# Patient Record
Sex: Female | Born: 1965 | Race: White | Hispanic: No | Marital: Married | Smoking: Never smoker
Health system: Southern US, Community
[De-identification: ages and names within clinical notes are randomized; demographics above are authoritative.]

## PROBLEM LIST (undated history)

## (undated) DIAGNOSIS — G43909 Migraine, unspecified, not intractable, without status migrainosus: Secondary | ICD-10-CM

## (undated) DIAGNOSIS — F419 Anxiety disorder, unspecified: Secondary | ICD-10-CM

## (undated) DIAGNOSIS — F4323 Adjustment disorder with mixed anxiety and depressed mood: Secondary | ICD-10-CM

## (undated) DIAGNOSIS — F329 Major depressive disorder, single episode, unspecified: Secondary | ICD-10-CM

## (undated) DIAGNOSIS — N926 Irregular menstruation, unspecified: Secondary | ICD-10-CM

## (undated) DIAGNOSIS — T7840XA Allergy, unspecified, initial encounter: Secondary | ICD-10-CM

## (undated) DIAGNOSIS — N941 Unspecified dyspareunia: Secondary | ICD-10-CM

## (undated) DIAGNOSIS — N952 Postmenopausal atrophic vaginitis: Secondary | ICD-10-CM

## (undated) DIAGNOSIS — R922 Inconclusive mammogram: Secondary | ICD-10-CM

## (undated) DIAGNOSIS — G43109 Migraine with aura, not intractable, without status migrainosus: Secondary | ICD-10-CM

## (undated) DIAGNOSIS — R519 Headache, unspecified: Secondary | ICD-10-CM

## (undated) DIAGNOSIS — F32A Depression, unspecified: Secondary | ICD-10-CM

## (undated) DIAGNOSIS — R51 Headache: Secondary | ICD-10-CM

## (undated) DIAGNOSIS — R030 Elevated blood-pressure reading, without diagnosis of hypertension: Secondary | ICD-10-CM

## (undated) DIAGNOSIS — Q782 Osteopetrosis: Secondary | ICD-10-CM

## (undated) DIAGNOSIS — J302 Other seasonal allergic rhinitis: Secondary | ICD-10-CM

## (undated) HISTORY — DX: Osteopetrosis: Q78.2

## (undated) HISTORY — DX: Migraine, unspecified, not intractable, without status migrainosus: G43.909

## (undated) HISTORY — DX: Anxiety disorder, unspecified: F41.9

## (undated) HISTORY — DX: Allergy, unspecified, initial encounter: T78.40XA

## (undated) HISTORY — DX: Depression, unspecified: F32.A

## (undated) HISTORY — DX: Elevated blood-pressure reading, without diagnosis of hypertension: R03.0

## (undated) HISTORY — DX: Unspecified dyspareunia: N94.10

## (undated) HISTORY — DX: Postmenopausal atrophic vaginitis: N95.2

## (undated) HISTORY — DX: Migraine with aura, not intractable, without status migrainosus: G43.109

## (undated) HISTORY — DX: Inconclusive mammogram: R92.2

## (undated) HISTORY — DX: Headache: R51

## (undated) HISTORY — DX: Adjustment disorder with mixed anxiety and depressed mood: F43.23

## (undated) HISTORY — DX: Irregular menstruation, unspecified: N92.6

## (undated) HISTORY — DX: Major depressive disorder, single episode, unspecified: F32.9

## (undated) HISTORY — PX: TUBAL LIGATION: SHX77

## (undated) HISTORY — DX: Other seasonal allergic rhinitis: J30.2

## (undated) HISTORY — DX: Headache, unspecified: R51.9

## (undated) SURGERY — LAPAROSCOPIC CHOLECYSTECTOMY
Anesthesia: Choice

---

## 2004-08-28 ENCOUNTER — Emergency Department: Payer: Self-pay | Admitting: Emergency Medicine

## 2005-05-23 ENCOUNTER — Ambulatory Visit: Payer: Self-pay

## 2005-05-30 ENCOUNTER — Ambulatory Visit: Payer: Self-pay

## 2005-10-10 ENCOUNTER — Emergency Department: Payer: Self-pay | Admitting: General Practice

## 2006-05-29 ENCOUNTER — Ambulatory Visit: Payer: Self-pay

## 2007-06-30 ENCOUNTER — Ambulatory Visit: Payer: Self-pay

## 2010-12-04 ENCOUNTER — Ambulatory Visit: Payer: Self-pay

## 2013-09-17 LAB — LIPID PANEL
CHOLESTEROL: 222 mg/dL — AB (ref 0–200)
HDL: 54 mg/dL (ref 35–70)
LDL Cholesterol: 149 mg/dL
Triglycerides: 94 mg/dL (ref 40–160)

## 2013-09-17 LAB — BASIC METABOLIC PANEL
Creatinine: 0.9 mg/dL (ref 0.5–1.1)
Glucose: 79 mg/dL
Potassium: 3.9 mmol/L (ref 3.4–5.3)
Sodium: 142 mmol/L (ref 137–147)

## 2013-09-17 LAB — TSH: TSH: 2.56 u[IU]/mL (ref 0.41–5.90)

## 2013-09-17 LAB — HM PAP SMEAR: HM Pap smear: NEGATIVE

## 2014-03-01 ENCOUNTER — Ambulatory Visit: Payer: Self-pay | Admitting: Otolaryngology

## 2014-09-15 ENCOUNTER — Encounter: Payer: Self-pay | Admitting: *Deleted

## 2014-09-20 ENCOUNTER — Ambulatory Visit (INDEPENDENT_AMBULATORY_CARE_PROVIDER_SITE_OTHER): Payer: BC Managed Care – PPO | Admitting: Obstetrics and Gynecology

## 2014-09-20 ENCOUNTER — Encounter: Payer: Self-pay | Admitting: Obstetrics and Gynecology

## 2014-09-20 VITALS — BP 119/80 | HR 78 | Ht 60.0 in | Wt 114.3 lb

## 2014-09-20 DIAGNOSIS — Z01419 Encounter for gynecological examination (general) (routine) without abnormal findings: Secondary | ICD-10-CM

## 2014-09-20 NOTE — Patient Instructions (Signed)
  Place postmenopausal annual exam patient instructions here.  Thank you for enrolling in MyChart. Please follow the instructions below to securely access your online medical record. MyChart allows you to send messages to your doctor, view your test results, manage appointments, and more.   How Do I Sign Up? 1. In your Internet browser, go to [employer] and enter [email or web address]. 2. Click on the Sign Up Now link in the Sign In box. You will see the New Member Sign Up page. 3. Enter your MyChart Access Code exactly as it appears below. You will not need to use this code after you've completed the sign-up process. If you do not sign up before the expiration date, you must request a new code.  MyChart Access Code: [email or web address] Expires: 11/19/2014  8:32 AM  4. Enter your Social Security Number ([id number]) and Date of Birth (mm/dd/yyyy) as indicated and click Submit. You will be taken to the next sign-up page. 5. Create a MyChart ID. This will be your MyChart login ID and cannot be changed, so think of one that is secure and easy to remember. 6. Create a MyChart password. You can change your password at any time. 7. Enter your Password Reset Question and Answer. This can be used at a later time if you forget your password.  8. Enter your e-mail address. You will receive e-mail notification when new information is available in MyChart. 9. Click Sign Up. You can now view your medical record.   Additional Information Remember, MyChart is NOT to be used for urgent needs. For medical emergencies, dial 911.

## 2014-09-20 NOTE — Progress Notes (Signed)
  Subjective:    Jeanne Ramos is a 49 y.o. female who presents for an annual exam. The patient has no complaints today. The patient is sexually active. GYN screening history: last pap: was normal and last mammogram: was normal. The patient wears seatbelts: yes. The patient participates in regular exercise: yes. Has the patient ever been transfused or tattooed?: no. The patient reports that there is not domestic violence in her life.   Menstrual History: OB History    Gravida Para Term Preterm AB TAB SAB Ectopic Multiple Living   3 3 3       2       Menarche age: [age]  No LMP recorded. Patient is postmenopausal.    The following portions of the patient's history were reviewed and updated as appropriate: allergies, current medications, past family history, past medical history, past social history, past surgical history and problem list.  Review of Systems A comprehensive review of systems was negative.    Objective:    BP 119/80 mmHg  Pulse 78  Ht 5' (1.524 m)  Wt 114 lb 4.8 oz (51.846 kg)  BMI 22.32 kg/m2  General Appearance:    Alert, cooperative, no distress, appears stated age  Head:    Normocephalic, without obvious abnormality, atraumatic  Eyes:    PERRL, conjunctiva/corneas clear, EOM's intact, fundi    benign, both eyes  Ears:    Normal TM's and external ear canals, both ears  Nose:   Nares normal, septum midline, mucosa normal, no drainage    or sinus tenderness  Throat:   Lips, mucosa, and tongue normal; teeth and gums normal  Neck:   Supple, symmetrical, trachea midline, no adenopathy;    thyroid:  no enlargement/tenderness/nodules; no carotid   bruit or JVD  Back:     Symmetric, no curvature, ROM normal, no CVA tenderness  Lungs:     Clear to auscultation bilaterally, respirations unlabored  Chest Wall:    No tenderness or deformity   Heart:    Regular rate and rhythm, S1 and S2 normal, no murmur, rub   or gallop  Breast Exam:    No tenderness, masses, or nipple  abnormality  Abdomen:     Soft, non-tender, bowel sounds active all four quadrants,    no masses, no organomegaly  Genitalia:    Normal female without lesion, discharge or tenderness  Rectal:    Normal tone, normal prostate, no masses or tenderness;   guaiac negative stool  Extremities:   Extremities normal, atraumatic, no cyanosis or edema  Pulses:   2+ and symmetric all extremities  Skin:   Skin color, texture, turgor normal, no rashes or lesions  Lymph nodes:   Cervical, supraclavicular, and axillary nodes normal  Neurologic:   CNII-XII intact, normal strength, sensation and reflexes    throughout  .    Assessment:    Healthy female exam.  amenorrhea secondary to menopause   Plan:     Mammogram.  Routine screening labs RTC 1 year or prn.  Melody Ines BloomerBurr, CNM

## 2014-09-21 LAB — LIPID PANEL
CHOL/HDL RATIO: 4.1 ratio (ref 0.0–4.4)
CHOLESTEROL TOTAL: 212 mg/dL — AB (ref 100–199)
HDL: 52 mg/dL (ref >39)
LDL Calculated: 139 mg/dL — ABNORMAL HIGH (ref 0–99)
Triglycerides: 106 mg/dL (ref 0–149)
VLDL Cholesterol Cal: 21 mg/dL (ref 5–40)

## 2014-09-21 LAB — COMPREHENSIVE METABOLIC PANEL
ALK PHOS: 29 IU/L — AB (ref 39–117)
ALT: 18 IU/L (ref 0–32)
AST: 20 IU/L (ref 0–40)
Albumin/Globulin Ratio: 2.5 (ref 1.1–2.5)
Albumin: 4.5 g/dL (ref 3.5–5.5)
BUN/Creatinine Ratio: 17 (ref 9–23)
BUN: 17 mg/dL (ref 6–24)
Bilirubin Total: 0.5 mg/dL (ref 0.0–1.2)
CHLORIDE: 101 mmol/L (ref 97–108)
CO2: 23 mmol/L (ref 18–29)
CREATININE: 1.02 mg/dL — AB (ref 0.57–1.00)
Calcium: 9.3 mg/dL (ref 8.7–10.2)
GFR calc Af Amer: 75 mL/min/{1.73_m2} (ref >59)
GFR, EST NON AFRICAN AMERICAN: 65 mL/min/{1.73_m2} (ref >59)
GLOBULIN, TOTAL: 1.8 g/dL (ref 1.5–4.5)
Glucose: 74 mg/dL (ref 65–99)
Potassium: 3.9 mmol/L (ref 3.5–5.2)
Sodium: 141 mmol/L (ref 134–144)
Total Protein: 6.3 g/dL (ref 6.0–8.5)

## 2014-09-21 LAB — VITAMIN D 25 HYDROXY (VIT D DEFICIENCY, FRACTURES): Vit D, 25-Hydroxy: 30.7 ng/mL (ref 30.0–100.0)

## 2014-09-21 LAB — FOLLICLE STIMULATING HORMONE: FSH: 58.5 m[IU]/mL

## 2014-09-26 ENCOUNTER — Encounter: Payer: Self-pay | Admitting: *Deleted

## 2014-09-29 ENCOUNTER — Ambulatory Visit
Admission: RE | Admit: 2014-09-29 | Discharge: 2014-09-29 | Disposition: A | Discharge status: Home / Self Care | Payer: BC Managed Care – PPO | Source: Ambulatory Visit | Attending: Obstetrics and Gynecology | Admitting: Obstetrics and Gynecology

## 2014-09-29 DIAGNOSIS — Z01419 Encounter for gynecological examination (general) (routine) without abnormal findings: Secondary | ICD-10-CM | POA: Diagnosis present

## 2014-09-29 DIAGNOSIS — Z1231 Encounter for screening mammogram for malignant neoplasm of breast: Secondary | ICD-10-CM | POA: Diagnosis not present

## 2014-09-30 ENCOUNTER — Encounter: Payer: Self-pay | Admitting: *Deleted

## 2014-12-22 DIAGNOSIS — F4323 Adjustment disorder with mixed anxiety and depressed mood: Secondary | ICD-10-CM | POA: Insufficient documentation

## 2014-12-22 HISTORY — DX: Adjustment disorder with mixed anxiety and depressed mood: F43.23

## 2015-09-22 ENCOUNTER — Encounter: Payer: BC Managed Care – PPO | Admitting: Obstetrics and Gynecology

## 2015-09-28 ENCOUNTER — Encounter: Payer: Self-pay | Admitting: Obstetrics and Gynecology

## 2015-09-28 ENCOUNTER — Ambulatory Visit (INDEPENDENT_AMBULATORY_CARE_PROVIDER_SITE_OTHER): Payer: BC Managed Care – PPO | Admitting: Obstetrics and Gynecology

## 2015-09-28 VITALS — BP 133/79 | HR 84 | Ht 60.0 in | Wt 113.8 lb

## 2015-09-28 DIAGNOSIS — Z01419 Encounter for gynecological examination (general) (routine) without abnormal findings: Secondary | ICD-10-CM

## 2015-09-28 NOTE — Patient Instructions (Signed)
  Place annual gynecologic exam patient instructions here.  Thank you for enrolling in MyChart. Please follow the instructions below to securely access your online medical record. MyChart allows you to send messages to your doctor, view your test results, manage appointments, and more.   How Do I Sign Up? 1. In your Internet browser, go to [employer] Address [employer] and enter [email or web address]. 2. Click on the Sign Up Now link in the Sign In box. You will see the New Member Sign Up page. 3. Enter your MyChart Access Code exactly as it appears below. You will not need to use this code after you've completed the sign-up process. If you do not sign up before the expiration date, you must request a new code.  MyChart Access Code: [email or web address] Expires: 11/27/2015  3:33 PM  4. Enter your Social Security Number ([id number]) and Date of Birth (mm/dd/yyyy) as indicated and click Submit. You will be taken to the next sign-up page. 5. Create a MyChart ID. This will be your MyChart login ID and cannot be changed, so think of one that is secure and easy to remember. 6. Create a MyChart password. You can change your password at any time. 7. Enter your Password Reset Question and Answer. This can be used at a later time if you forget your password.  8. Enter your e-mail address. You will receive e-mail notification when new information is available in MyChart. 9. Click Sign Up. You can now view your medical record.   Additional Information Remember, MyChart is NOT to be used for urgent needs. For medical emergencies, dial 911.

## 2015-09-28 NOTE — Progress Notes (Signed)
Subjective:   Jeanne Ramos is a 50 y.o. [id number] Caucasian female here for a routine well-woman exam.  No LMP recorded. Patient is postmenopausal.    Current complaints: none PCP: none       doesn't desire labs  Social History: Sexual: heterosexual Marital Status: married Living situation: with spouse Occupation: Runner, broadcasting/film/videoteacher Tobacco/alcohol: no tobacco use Illicit drugs: no history of illicit drug use  The following portions of the patient's history were reviewed and updated as appropriate: allergies, current medications, past family history, past medical history, past social history, past surgical history and problem list.  Past Medical History Past Medical History  Diagnosis Date  . Anxiety   . Depression   . Irregular menses     Past Surgical History Past Surgical History  Procedure Laterality Date  . Tubal ligation    . Cesarean section  [id number],1993,1999    Gynecologic History [id number]  No LMP recorded. Patient is postmenopausal. Contraception: post menopausal status Last Pap: 2015. Results were: normal Last mammogram: 2016. Results were: normal  Obstetric History OB History  Gravida Para Term Preterm AB SAB TAB Ectopic Multiple Living  3 3 3       2     # Outcome Date GA Lbr Len/2nd Weight Sex Delivery Anes PTL Lv  3 Term           2 Term           1 Term               Current Medications No current outpatient prescriptions on file prior to visit.   No current facility-administered medications on file prior to visit.    Review of Systems Patient denies any headaches, blurred vision, shortness of breath, chest pain, abdominal pain, problems with bowel movements, urination, or intercourse.  Objective:  BP 133/79 mmHg  Pulse 84  Ht 5' (1.524 m)  Wt 113 lb 12.8 oz (51.619 kg)  BMI 22.22 kg/m2 Physical Exam  General:  Well developed, well nourished, no acute distress. She is alert and oriented x3. Skin:  Warm and dry Neck:  Midline trachea, no  thyromegaly or nodules Cardiovascular: Regular rate and rhythm, no murmur heard Lungs:  Effort normal, all lung fields clear to auscultation bilaterally Breasts:  No dominant palpable mass, retraction, or nipple discharge Abdomen:  Soft, non tender, no hepatosplenomegaly or masses Pelvic:  External genitalia is normal in appearance.  The vagina is normal in appearance. The cervix is bulbous, no CMT.  Thin prep pap is not done. Uterus is felt to be normal size, shape, and contour.  No adnexal masses or tenderness noted. Extremities:  No swelling or varicosities noted Psych:  She has a normal mood and affect  Assessment:   Healthy well-woman exam  Plan:  No labs indicated F/U 1 year for AE, or sooner if needed Mammogram scheduled  Jeanne Ramos Jeanne Ramos, CNM

## 2015-10-17 ENCOUNTER — Ambulatory Visit
Admission: RE | Admit: 2015-10-17 | Discharge: 2015-10-17 | Disposition: A | Discharge status: Home / Self Care | Payer: BC Managed Care – PPO | Source: Ambulatory Visit | Attending: Obstetrics and Gynecology | Admitting: Obstetrics and Gynecology

## 2015-10-17 ENCOUNTER — Other Ambulatory Visit: Payer: Self-pay | Admitting: Obstetrics and Gynecology

## 2015-10-17 DIAGNOSIS — Z01419 Encounter for gynecological examination (general) (routine) without abnormal findings: Secondary | ICD-10-CM

## 2015-10-17 DIAGNOSIS — Z1231 Encounter for screening mammogram for malignant neoplasm of breast: Secondary | ICD-10-CM | POA: Insufficient documentation

## 2016-07-17 ENCOUNTER — Emergency Department: Payer: BC Managed Care – PPO

## 2016-07-17 ENCOUNTER — Encounter: Payer: Self-pay | Admitting: Emergency Medicine

## 2016-07-17 ENCOUNTER — Emergency Department
Admission: EM | Admit: 2016-07-17 | Discharge: 2016-07-17 | Discharge status: Against Medical Advice | Payer: BC Managed Care – PPO | Attending: Emergency Medicine | Admitting: Emergency Medicine

## 2016-07-17 DIAGNOSIS — L568 Other specified acute skin changes due to ultraviolet radiation: Secondary | ICD-10-CM

## 2016-07-17 DIAGNOSIS — R200 Anesthesia of skin: Secondary | ICD-10-CM | POA: Insufficient documentation

## 2016-07-17 DIAGNOSIS — R51 Headache: Secondary | ICD-10-CM | POA: Diagnosis not present

## 2016-07-17 DIAGNOSIS — R519 Headache, unspecified: Secondary | ICD-10-CM

## 2016-07-17 DIAGNOSIS — Z7982 Long term (current) use of aspirin: Secondary | ICD-10-CM | POA: Diagnosis not present

## 2016-07-17 DIAGNOSIS — H538 Other visual disturbances: Secondary | ICD-10-CM

## 2016-07-17 LAB — URINALYSIS, COMPLETE (UACMP) WITH MICROSCOPIC
BACTERIA UA: NONE SEEN
Bilirubin Urine: NEGATIVE
GLUCOSE, UA: NEGATIVE mg/dL
KETONES UR: 5 mg/dL — AB
Leukocytes, UA: NEGATIVE
Nitrite: NEGATIVE
PROTEIN: NEGATIVE mg/dL
Specific Gravity, Urine: 1.019 (ref 1.005–1.030)
pH: 5 (ref 5.0–8.0)

## 2016-07-17 LAB — BASIC METABOLIC PANEL
ANION GAP: 8 (ref 5–15)
BUN: 16 mg/dL (ref 6–20)
CHLORIDE: 104 mmol/L (ref 101–111)
CO2: 27 mmol/L (ref 22–32)
Calcium: 9.1 mg/dL (ref 8.9–10.3)
Creatinine, Ser: 0.75 mg/dL (ref 0.44–1.00)
GFR calc non Af Amer: >60 mL/min (ref >60)
GLUCOSE: 99 mg/dL (ref 65–99)
POTASSIUM: 3.3 mmol/L — AB (ref 3.5–5.1)
SODIUM: 139 mmol/L (ref 135–145)

## 2016-07-17 LAB — CBC
HEMATOCRIT: 37.7 % (ref 35.0–47.0)
HEMOGLOBIN: 13 g/dL (ref 12.0–16.0)
MCH: 30.2 pg (ref 26.0–34.0)
MCHC: 34.4 g/dL (ref 32.0–36.0)
MCV: 87.7 fL (ref 80.0–100.0)
Platelets: 255 10*3/uL (ref 150–440)
RBC: 4.3 MIL/uL (ref 3.80–5.20)
RDW: 13.6 % (ref 11.5–14.5)
WBC: 7.7 10*3/uL (ref 3.6–11.0)

## 2016-07-17 MED ORDER — DIPHENHYDRAMINE HCL 50 MG/ML IJ SOLN
12.5000 mg | Freq: Once | INTRAMUSCULAR | Status: AC
  Administered 2016-07-17: 12.5 mg via INTRAVENOUS
  Filled 2016-07-17: qty 1

## 2016-07-17 MED ORDER — SODIUM CHLORIDE 0.9 % IV BOLUS (SEPSIS)
1000.0000 mL | Freq: Once | INTRAVENOUS | Status: AC
  Administered 2016-07-17: 1000 mL via INTRAVENOUS

## 2016-07-17 MED ORDER — KETOROLAC TROMETHAMINE 30 MG/ML IJ SOLN
30.0000 mg | Freq: Once | INTRAMUSCULAR | Status: AC
  Administered 2016-07-17: 30 mg via INTRAVENOUS
  Filled 2016-07-17: qty 1

## 2016-07-17 MED ORDER — PROCHLORPERAZINE EDISYLATE 5 MG/ML IJ SOLN
10.0000 mg | Freq: Once | INTRAMUSCULAR | Status: AC
  Administered 2016-07-17: 10 mg via INTRAVENOUS
  Filled 2016-07-17: qty 2

## 2016-07-17 NOTE — ED Provider Notes (Signed)
[facility] Emergency Department Provider Note  ____________________________________________  Time seen: Approximately 4:48 PM  I have reviewed the triage vital signs and the nursing notes.   HISTORY  Chief Complaint Weakness and Headache    HPI Jeanne Ramos is a 51 y.o. female with a history of migraines presenting with headache, left facial numbness, blurred vision. The patient reports that over the last several years, the frequency of her migraines have been increasing. She reports that for the past 3 weeks, every several days she is developing a frontal headache associated with left facial tingling and blurred vision, as well as visual auras, all of which resolve with Excedrin Migraine. For the past 2 days, she has been taking Excedrin Migraine which mildly alleviates the pain, but then her headache returns. She notes a few times where she may have stumbled while walking but no true gait imbalance. She found a tick on her yesterday which was not embedded and has not found any takes the season. She has not had any fever or neck pain, no rash. No trauma. This headache feels similar to previous migraines in character but is worse in severity; the aura is also typical of her previous migraines; the left facial numbness and blurred vision or new symptoms.   Past Medical History:  Diagnosis Date  . Anxiety   . Depression   . Irregular menses     There are no active problems to display for this patient.   Past Surgical History:  Procedure Laterality Date  . CESAREAN SECTION  [phone]  . TUBAL LIGATION        Allergies Demerol [meperidine]  Family History  Problem Relation Age of Onset  . Breast cancer Sister 83  . Breast cancer Paternal Grandmother     Social History Social History  Substance Use Topics  . Smoking status: Never Smoker  . Smokeless tobacco: Never Used  . Alcohol use No    Review of Systems Constitutional: No  fever/chills.No lightheadedness or syncope. Eyes: Positive intermittent blurred vision during migraines. Positive light auras. Positive photosensitivity. Normal vision when the patient is not having migraine. ENT: No congestion or rhinorrhea. No dental pain. Cardiovascular: Denies chest pain. Denies palpitations. Respiratory: Denies shortness of breath.  No cough. Gastrointestinal: No abdominal pain.  No nausea, no vomiting.  No diarrhea.  No constipation. Genitourinary: Negative for dysuria. Musculoskeletal: Negative for back pain. Skin: Negative for rash. Neurological: Positive frontal headache with photosensitivity and light auras. Positive left facial tingling. No difficulty with speech or confusion.  10-point ROS otherwise negative.  ____________________________________________   PHYSICAL EXAM:  VITAL SIGNS: ED Triage Vitals [07/17/16 1259]  Enc Vitals Group     BP (!) 146/89     Pulse Rate 79     Resp 18     Temp 98.2 F (36.8 C)     Temp Source Oral     SpO2 100 %     Weight 106 lb (48.1 kg)     Height 5' (1.524 m)     Head Circumference      Peak Flow      Pain Score 7     Pain Loc      Pain Edu?      Excl. in GC?     Constitutional: Alert and oriented. Well appearing and in no acute distress. Answers questions appropriately.GCS is 15. Eyes: Conjunctivae are normal.  EOMI. PERRLA. No horizontal or vertical nystagmus. No scleral icterus. Head: Atraumatic. Nose: No congestion/rhinnorhea.  Mouth/Throat: Mucous membranes are moist.  Neck: No stridor.  Supple.  No JVD. No meningismus. Cardiovascular: Normal rate, regular rhythm. No murmurs, rubs or gallops.  Respiratory: Normal respiratory effort.  No accessory muscle use or retractions. Lungs CTAB.  No wheezes, rales or ronchi. Gastrointestinal: Soft, nontender and nondistended.  No guarding or rebound.  No peritoneal signs. Musculoskeletal: No LE edema. No ttp in the calves or palpable cords.  Negative Homan's  sign. Neurologic: Alert and oriented 3. Speech is clear.  Face and smile symmetric. Tongue is midline. EOMI. PERRLA. No horizontal or vertical nystagmus. 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 No pronator drift. 5 out of 5 grip, biceps, triceps, hip flexors, plantar flexion and dorsiflexion. Normal sensation to light touch in the bilateral upper and lower extremities, but decreased sensation to light touch in the left face.. Normal heel-to-shin. Skin:  Skin is warm, dry and intact. No rash noted. Psychiatric: Mood and affect are normal. Speech and behavior are normal.  Normal judgement  ____________________________________________   LABS (all labs ordered are listed, but only abnormal results are displayed)  Labs Reviewed  BASIC METABOLIC PANEL - Abnormal; Notable for the following:       Result Value   Potassium 3.3 (*)    All other components within normal limits  URINALYSIS, COMPLETE (UACMP) WITH MICROSCOPIC - Abnormal; Notable for the following:    Color, Urine YELLOW (*)    APPearance CLEAR (*)    Hgb urine dipstick SMALL (*)    Ketones, ur 5 (*)    Squamous Epithelial / LPF 0-5 (*)    All other components within normal limits  CBC   ____________________________________________  EKG  ED ECG REPORT I, Rockne MenghiniNorman, Anne-Caroline, the attending physician, personally viewed and interpreted this ECG.   Date: 07/17/2016  EKG Time: 1307  Rate: 69  Rhythm: normal sinus rhythm  Axis: normal  Intervals:none  ST&T Change: Nonspecific T-wave inversion in V1 and V2. No ST elevation.  ____________________________________________  RADIOLOGY  Ct Head Wo Contrast  Result Date: 07/17/2016 CLINICAL DATA:  Left facial tingling and numbness intermittently. Headaches. EXAM: CT HEAD WITHOUT CONTRAST TECHNIQUE: Contiguous axial images were obtained from the base of the skull through the vertex without intravenous contrast. COMPARISON:  None.  FINDINGS: Brain: The ventricles are normal in size and configuration. There is no intracranial mass, hemorrhage, extra-axial fluid collection, or midline shift. Gray-white compartments are normal. No evident acute infarct. Vascular: No hyperdense vessel.  No evident vascular calcification. Skull: The bony calvarium appears intact. Sinuses/Orbits: Visualized paranasal sinuses are clear. Visualized orbits appear symmetric bilaterally. Other: Visualized mastoid air cells are clear. IMPRESSION: Study within normal limits. Electronically Signed   By: Bretta BangWilliam  Woodruff III M.D.   On: 07/17/2016 16:58    ____________________________________________   PROCEDURES  Procedure(s) performed: None  Procedures  Critical Care performed: No ____________________________________________   INITIAL IMPRESSION / ASSESSMENT AND PLAN / ED COURSE  Pertinent labs & imaging results that were available during my care of the patient were reviewed by me and considered in my medical decision making (see chart for details).  51 y.o. female with a history of migraines presenting with progressively more frequent migraines, now with additional left facial numbness and blurred vision. Overall, the patient does have left facial numbness on my examination but otherwise has no other focal neurologic deficits. Her vital signs are reassuring and her blood pressure is normal. It is likely that the patient is  having atypical migraines, but acute stroke is not ruled out at this time. We'll get a CT scan, followed by MRI if this is normal. I will treat her headache. We'll get basic labs. I've initiated symptomatic treatment at this time  I spoken with Dr. Thad Ranger, the neurologist on-call, who agrees that if the patient has a negative CT and normal MRI, that she will not need further inpatient evaluation. We will refer her to an outpatient neurologist for control of her migraines.  ----------------------------------------- 7:07 PM on  07/17/2016 -----------------------------------------  The patient was unable to tolerate MRI. I have had a long discussion with the patient about the importance of this study, and have offered her multiple options including anti-anxiolytics. At this time, she adamantly refuses to complete her testing. We have had long discussion about the risks and benefits of this test, and she understands that she will be discharged AGAINST MEDICAL ADVICE. She understands the risks of worsening symptoms, permanent neurologic deficits, as well as death. I will give her the information for neurologic follow-up, and return precautions were discussed.  ____________________________________________  FINAL CLINICAL IMPRESSION(S) / ED DIAGNOSES  Final diagnoses:  Acute nonintractable headache, unspecified headache type  Left facial numbness  Blurred vision, bilateral  Photosensitivity         NEW MEDICATIONS STARTED DURING THIS VISIT:  New Prescriptions   No medications on file      Rockne Menghini, MD 07/17/16 Windell Moment

## 2016-07-17 NOTE — ED Notes (Signed)
Patient transported to CT 

## 2016-07-17 NOTE — ED Triage Notes (Signed)
Pt to ed with c/o left side of face tingling and numb intermittently over the last 3 weeks,  Pt reports yesterday started having a severe headache worse today.  Rates pain 7/10.  No obvious facial drooping, no disorientation, no confusion noted. Pt alert and oriented with family.  No noted unilateral weakness noted at this time.

## 2016-07-17 NOTE — ED Notes (Signed)
Pt back from MRI 

## 2016-07-17 NOTE — ED Notes (Signed)
ED Provider at bedside. 

## 2016-07-17 NOTE — Discharge Instructions (Signed)
Today, your symptoms are most likely due to migraine. However, we were unable to complete your evaluation to rule out stroke as a possible cause for your symptoms. Your choosing to leave AGAINST MEDICAL ADVICE, and we have discussed the risks of worsening symptoms, permanent neurologic damage or even death.  Please make an appointment with Dr. Malvin JohnsPotter, the neurologist on-call, for further evaluation and treatment.  Please return to the emergency department if you develop severe pain, vomiting, numbness tingling or weakness, changes in vision or speech, confusion, or any other symptoms concerning to you.

## 2016-07-17 NOTE — ED Notes (Signed)
Patient transported to MRI 

## 2016-07-19 ENCOUNTER — Encounter: Payer: Self-pay | Admitting: Family

## 2016-07-19 ENCOUNTER — Ambulatory Visit (INDEPENDENT_AMBULATORY_CARE_PROVIDER_SITE_OTHER): Payer: BC Managed Care – PPO | Admitting: Family

## 2016-07-19 DIAGNOSIS — R030 Elevated blood-pressure reading, without diagnosis of hypertension: Secondary | ICD-10-CM

## 2016-07-19 DIAGNOSIS — G43109 Migraine with aura, not intractable, without status migrainosus: Secondary | ICD-10-CM

## 2016-07-19 HISTORY — DX: Migraine with aura, not intractable, without status migrainosus: G43.109

## 2016-07-19 HISTORY — DX: Elevated blood-pressure reading, without diagnosis of hypertension: R03.0

## 2016-07-19 NOTE — Progress Notes (Signed)
Subjective:    Patient ID: Jeanne Ramos, female    DOB: 07/05/1965, 51 y.o.   MRN: [id number]  Chief Complaint  Patient presents with  . Establish Care    follow up from ED for migraine and BP    HPI:  Jeanne Maskatricia B Scallan is a 51 y.o. female who  has a past medical history of Allergy; Anxiety; Depression; Frequent headaches; Irregular menses; and Migraines. and presents today for an office visit to establish care.  1.) Migraine headaches - Previously diagnosed with migraine headaches that are progressively worsening with severity and frequency. Modifying factors include pain on the left side of her head with an auora before it starts by seeing stars. Pain is described as throbbing with sensitivity to light and sound with no associated nausea or vomiting. Mother and sibilings have migraine headaches. Modifying factors include Excedrin migraine which seems to help control her symptoms. Severity can be enough that she experiences numbness and tingling in the left side of the face. Is not aware of any triggering factors. Bad headaches have been 1-2 times per week. Usually headache free 5-6 days per week. Was seen in the ED with migraine symptoms and numbness of the face with concern for possible stroke. CT scan was negative. Unable to complete MRI secondary to anxiety. She did leave against medical advice. Since leaving the ED her symptoms have improved with no deficits or headaches.    2.) Elevated blood pressure - Has had several recent episodes of elevated blood pressure. Does not have a history of blood pressure issues. Does have migraine headaches as described above. Denies other symptoms of end organ damage. Does not currently check her blood pressure at home. Not following a low sodium diet.   BP Readings from Last 3 Encounters:  07/19/16 (!) 142/94  07/17/16 (!) 135/105  09/28/15 133/79    Allergies  Allergen Reactions  . Demerol [Meperidine]       Outpatient Medications Prior  to Visit  Medication Sig Dispense Refill  . aspirin-acetaminophen-caffeine (EXCEDRIN MIGRAINE) 250-250-65 MG tablet Take 1 tablet by mouth daily as needed.    . cholecalciferol (VITAMIN D) 1000 units tablet Take 1,000 Units by mouth daily.     No facility-administered medications prior to visit.      Past Medical History:  Diagnosis Date  . Allergy   . Anxiety   . Depression   . Frequent headaches   . Irregular menses   . Migraines       Past Surgical History:  Procedure Laterality Date  . CESAREAN SECTION  [phone],1999  . TUBAL LIGATION        Family History  Problem Relation Age of Onset  . Breast cancer Sister 928  . Breast cancer Paternal Grandmother   . Heart disease Mother   . Heart disease Father       Social History   Social History  . Marital status: Married    Spouse name: N/A  . Number of children: 3  . Years of education: 116   Occupational History  . Educator    Social History Main Topics  . Smoking status: Never Smoker  . Smokeless tobacco: Never Used  . Alcohol use No  . Drug use: No  . Sexual activity: Yes   Other Topics Concern  . Not on file   Social History Narrative   Fun/Hobby: read and sew   Denies abuse and feels safe at home      Review of Systems  Constitutional: Negative for chills and fever.  Eyes:       Negative for changes in vision  Respiratory: Negative for cough, chest tightness and wheezing.   Cardiovascular: Negative for chest pain, palpitations and leg swelling.  Neurological: Negative for dizziness, weakness and light-headedness.       Objective:    BP (!) 142/94 (BP Location: Left Arm, Patient Position: Sitting, Cuff Size: Normal)   Pulse 73   Temp 98.1 F (36.7 C) (Oral)   Resp 16   Ht 5' (1.524 m)   Wt 104 lb 6.4 oz (47.4 kg)   SpO2 98%   BMI 20.39 kg/m  Nursing note and vital signs reviewed.  Physical Exam  Constitutional: She is oriented to person, place, and time. She appears  well-developed and well-nourished. No distress.  HENT:  Right Ear: Hearing, tympanic membrane, external ear and ear canal normal.  Left Ear: Hearing, tympanic membrane, external ear and ear canal normal.  Nose: Nose normal.  Mouth/Throat: Uvula is midline, oropharynx is clear and moist and mucous membranes are normal.  Eyes: Conjunctivae and EOM are normal. Pupils are equal, round, and reactive to light.  Cardiovascular: Normal rate, regular rhythm, normal heart sounds and intact distal pulses.  Exam reveals no gallop and no friction rub.   No murmur heard. Pulmonary/Chest: Effort normal and breath sounds normal. No respiratory distress. She has no wheezes. She has no rales. She exhibits no tenderness.  Musculoskeletal: She exhibits no edema.  Neurological: She is alert and oriented to person, place, and time. No cranial nerve deficit.  Skin: Skin is warm and dry.  Psychiatric: She has a normal mood and affect. Her behavior is normal. Judgment and thought content normal.        Assessment & Plan:   Problem List Items Addressed This Visit      Cardiovascular and Mediastinum   Migraine headache with aura    Symptoms and exam are consistent with complicated migraine headaches with aura resulting in numbness and tingling in the left side of the face. Intensity and frequency of headaches has been increasing, however continued to be adequately managed with Excedrin Migraine. Encouraged to monitor for triggers. Reassuring CT scan was negative and no current symptoms or deficits. Consider prophylactic medications if frequency continues. Continue OTC Excedrine migraine as needed for headaches. Follow in 1 month or sooner.         Other   Elevated blood pressure reading    Multiple elevated blood pressure with concern for relation to headache pains. Denies any current headache or worst headache of life with no symptoms of end organ damage noted on physical exam. Monitor blood pressure at home and  follow low sodium diet. Follow up blood pressure readings in 1-2 weeks. Continue to monitor.           I am having Ms. Withrow maintain her aspirin-acetaminophen-caffeine and cholecalciferol.   Follow-up: Return in about 1 month (around 08/19/2016), or if symptoms worsen or fail to improve.  Jeanine Luz, FNP

## 2016-07-19 NOTE — Assessment & Plan Note (Signed)
Symptoms and exam are consistent with complicated migraine headaches with aura resulting in numbness and tingling in the left side of the face. Intensity and frequency of headaches has been increasing, however continued to be adequately managed with Excedrin Migraine. Encouraged to monitor for triggers. Reassuring CT scan was negative and no current symptoms or deficits. Consider prophylactic medications if frequency continues. Continue OTC Excedrine migraine as needed for headaches. Follow in 1 month or sooner.

## 2016-07-19 NOTE — Assessment & Plan Note (Signed)
Multiple elevated blood pressure with concern for relation to headache pains. Denies any current headache or worst headache of life with no symptoms of end organ damage noted on physical exam. Monitor blood pressure at home and follow low sodium diet. Follow up blood pressure readings in 1-2 weeks. Continue to monitor.

## 2016-07-19 NOTE — Patient Instructions (Addendum)
Thank you for choosing [facility].  SUMMARY AND INSTRUCTIONS:  Recommend monitoring your blood pressure at least 1x per day at different times throughout the day. Record these numbers and follow up in 1-2 weeks with some readings.   Recommend a low sodium diet.   Monitor your headaches and evaluate for triggers.   Continue with the Excedrin migraine as needed for headaches.   If the frequency increases we may consider a daily medication like propranolol or toparimate (information below).  Schedule a time for your annual wellness visit at your convenience.   Follow up:  If your symptoms worsen or fail to improve, please contact our office for further instruction, or in case of emergency go directly to the emergency room at the closest medical facility.     Migraine Headache A migraine headache is a very strong throbbing pain on one side or both sides of your head. Migraines can also cause other symptoms. Talk with your doctor about what things may bring on (trigger) your migraine headaches. Follow these instructions at home: Medicines   Take over-the-counter and prescription medicines only as told by your doctor.  Do not drive or use heavy machinery while taking prescription pain medicine.  To prevent or treat constipation while you are taking prescription pain medicine, your doctor may recommend that you:  Drink enough fluid to keep your pee (urine) clear or pale yellow.  Take over-the-counter or prescription medicines.  Eat foods that are high in fiber. These include fresh fruits and vegetables, whole grains, and beans.  Limit foods that are high in fat and processed sugars. These include fried and sweet foods. Lifestyle   Avoid alcohol.  Do not use any products that contain nicotine or tobacco, such as cigarettes and e-cigarettes. If you need help quitting, ask your doctor.  Get at least 8 hours of sleep every night.  Limit your stress. General instructions     Keep a journal to find out what may bring on your migraines. For example, write down:  What you eat and drink.  How much sleep you get.  Any change in what you eat or drink.  Any change in your medicines.  If you have a migraine:  Avoid things that make your symptoms worse, such as bright lights.  It may help to lie down in a dark, quiet room.  Do not drive or use heavy machinery.  Ask your doctor what activities are safe for you.  Keep all follow-up visits as told by your doctor. This is important. Contact a doctor if:  You get a migraine that is different or worse than your usual migraines. Get help right away if:  Your migraine gets very bad.  You have a fever.  You have a stiff neck.  You have trouble seeing.  Your muscles feel weak or like you cannot control them.  You start to lose your balance a lot.  You start to have trouble walking.  You pass out (faint). This information is not intended to replace advice given to you by your health care provider. Make sure you discuss any questions you have with your health care provider. Document Released: 12/05/2007 Document Revised: 09/15/2015 Document Reviewed: 08/14/2015 Elsevier Interactive Patient Education  2017 Else[employer] Inc.   DASH Eating Plan DASH stands for "Dietary Approaches to Stop Hypertension." The DASH eating plan is a healthy eating plan that has been shown to reduce high blood pressure (hypertension). It may also reduce your risk for type 2 diabetes, heart  disease, and stroke. The DASH eating plan may also help with weight loss. What are tips for following this plan? General guidelines   Avoid eating more than 2,300 mg (milligrams) of salt (sodium) a day. If you have hypertension, you may need to reduce your sodium intake to 1,500 mg a day.  Limit alcohol intake to no more than 1 drink a day for nonpregnant women and 2 drinks a day for men. One drink equals 12 oz of beer, 5 oz of wine, or 1 oz  of hard liquor.  Work with your health care provider to maintain a healthy body weight or to lose weight. Ask what an ideal weight is for you.  Get at least 30 minutes of exercise that causes your heart to beat faster (aerobic exercise) most days of the week. Activities may include walking, swimming, or biking.  Work with your health care provider or diet and nutrition specialist (dietitian) to adjust your eating plan to your individual calorie needs. Reading food labels   Check food labels for the amount of sodium per serving. Choose foods with less than 5 percent of the Daily Value of sodium. Generally, foods with less than 300 mg of sodium per serving fit into this eating plan.  To find whole grains, look for the word "whole" as the first word in the ingredient list. Shopping   Buy products labeled as "low-sodium" or "no salt added."  Buy fresh foods. Avoid canned foods and premade or frozen meals. Cooking   Avoid adding salt when cooking. Use salt-free seasonings or herbs instead of table salt or sea salt. Check with your health care provider or pharmacist before using salt substitutes.  Do not fry foods. Cook foods using healthy methods such as baking, boiling, grilling, and broiling instead.  Cook with heart-healthy oils, such as olive, canola, soybean, or sunflower oil. Meal planning    Eat a balanced diet that includes:  5 or more servings of fruits and vegetables each day. At each meal, try to fill half of your plate with fruits and vegetables.  Up to 6-8 servings of whole grains each day.  Less than 6 oz of lean meat, poultry, or fish each day. A 3-oz serving of meat is about the same size as a deck of cards. One egg equals 1 oz.  2 servings of low-fat dairy each day.  A serving of nuts, seeds, or beans 5 times each week.  Heart-healthy fats. Healthy fats called Omega-3 fatty acids are found in foods such as flaxseeds and coldwater fish, like sardines, salmon, and  mackerel.  Limit how much you eat of the following:  Canned or prepackaged foods.  Food that is high in trans fat, such as fried foods.  Food that is high in saturated fat, such as fatty meat.  Sweets, desserts, sugary drinks, and other foods with added sugar.  Full-fat dairy products.  Do not salt foods before eating.  Try to eat at least 2 vegetarian meals each week.  Eat more home-cooked food and less restaurant, buffet, and fast food.  When eating at a restaurant, ask that your food be prepared with less salt or no salt, if possible. What foods are recommended? The items listed may not be a complete list. Talk with your dietitian about what dietary choices are best for you. Grains  Whole-grain or whole-wheat bread. Whole-grain or whole-wheat pasta. Brown rice. Orpah Cobb. Bulgur. Whole-grain and low-sodium cereals. Pita bread. Low-fat, low-sodium crackers. Whole-wheat flour tortillas. Vegetables  Fresh or frozen vegetables (raw, steamed, roasted, or grilled). Low-sodium or reduced-sodium tomato and vegetable juice. Low-sodium or reduced-sodium tomato sauce and tomato paste. Low-sodium or reduced-sodium canned vegetables. Fruits  All fresh, dried, or frozen fruit. Canned fruit in natural juice (without added sugar). Meat and other protein foods  Skinless chicken or [place]. Ground chicken or [place]. Pork with fat trimmed off. Fish and seafood. Egg whites. Dried beans, peas, or lentils. Unsalted nuts, nut butters, and seeds. Unsalted canned beans. Lean cuts of beef with fat trimmed off. Low-sodium, lean deli meat. Dairy  Low-fat (1%) or fat-free (skim) milk. Fat-free, low-fat, or reduced-fat cheeses. Nonfat, low-sodium ricotta or cottage cheese. Low-fat or nonfat yogurt. Low-fat, low-sodium cheese. Fats and oils  Soft margarine without trans fats. Vegetable oil. Low-fat, reduced-fat, or light mayonnaise and salad dressings (reduced-sodium). Canola, safflower, olive, soybean,  and sunflower oils. Avocado. Seasoning and other foods  Herbs. Spices. Seasoning mixes without salt. Unsalted popcorn and pretzels. Fat-free sweets. What foods are not recommended? The items listed may not be a complete list. Talk with your dietitian about what dietary choices are best for you. Grains  Baked goods made with fat, such as croissants, muffins, or some breads. Dry pasta or rice meal packs. Vegetables  Creamed or fried vegetables. Vegetables in a cheese sauce. Regular canned vegetables (not low-sodium or reduced-sodium). Regular canned tomato sauce and paste (not low-sodium or reduced-sodium). Regular tomato and vegetable juice (not low-sodium or reduced-sodium). Rosita Fire. Olives. Fruits  Canned fruit in a light or heavy syrup. Fried fruit. Fruit in cream or butter sauce. Meat and other protein foods  Fatty cuts of meat. Ribs. Fried meat. Tomasa Blase. Sausage. Bologna and other processed lunch meats. Salami. Fatback. Hotdogs. Bratwurst. Salted nuts and seeds. Canned beans with added salt. Canned or smoked fish. Whole eggs or egg yolks. Chicken or [place] with skin. Dairy  Whole or 2% milk, cream, and half-and-half. Whole or full-fat cream cheese. Whole-fat or sweetened yogurt. Full-fat cheese. Nondairy creamers. Whipped toppings. Processed cheese and cheese spreads. Fats and oils  Butter. Stick margarine. Lard. Shortening. Ghee. Bacon fat. Tropical oils, such as coconut, palm kernel, or palm oil. Seasoning and other foods  Salted popcorn and pretzels. Onion salt, garlic salt, seasoned salt, table salt, and sea salt. Worcestershire sauce. Tartar sauce. Barbecue sauce. Teriyaki sauce. Soy sauce, including reduced-sodium. Steak sauce. Canned and packaged gravies. Fish sauce. Oyster sauce. Cocktail sauce. Horseradish that you find on the shelf. Ketchup. Mustard. Meat flavorings and tenderizers. Bouillon cubes. Hot sauce and Tabasco sauce. Premade or packaged marinades. Premade or packaged taco  seasonings. Relishes. Regular salad dressings. Where to find more information:  National Heart, Lung, and Blood Institute: PopSteam.is  American Heart Association: www.heart.org Summary  The DASH eating plan is a healthy eating plan that has been shown to reduce high blood pressure (hypertension). It may also reduce your risk for type 2 diabetes, heart disease, and stroke.  With the DASH eating plan, you should limit salt (sodium) intake to 2,300 mg a day. If you have hypertension, you may need to reduce your sodium intake to 1,500 mg a day.  When on the DASH eating plan, aim to eat more fresh fruits and vegetables, whole grains, lean proteins, low-fat dairy, and heart-healthy fats.  Work with your health care provider or diet and nutrition specialist (dietitian) to adjust your eating plan to your individual calorie needs. This information is not intended to replace advice given to you by your health care provider. Make sure you  discuss any questions you have with your health care provider. Document Released: 02/14/2011 Document Revised: 02/19/2016 Document Reviewed: 02/19/2016 Elsevier Interactive Patient Education  2017 Elsevier Inc.  Propranolol extended-release capsules What is this medicine? PROPRANOLOL (proe PRAN oh lole) is a beta-blocker. Beta-blockers reduce the workload on the heart and help it to beat more regularly. This medicine is used to treat high blood pressure, heart muscle disease, and prevent chest pain caused by angina. It is also used to prevent migraine headaches. You should not use this medicine to treat a migraine that has already started. This medicine may be used for other purposes; ask your health care provider or pharmacist if you have questions. COMMON BRAND NAME(S): Inderal LA, Inderal XL, InnoPran XL What should I tell my health care provider before I take this medicine? They need to know if you have any of these conditions: -circulation problems, or  blood vessel disease -diabetes -history of heart attack or heart disease, vasospastic angina -kidney disease -liver disease -lung or breathing disease, like asthma or emphysema -pheochromocytoma -slow heart rate -thyroid disease -an unusual or allergic reaction to propranolol, other beta-blockers, medicines, foods, dyes, or preservatives -pregnant or trying to get pregnant -breast-feeding How should I use this medicine? Take this medicine by mouth with a glass of water. Follow the directions on the prescription label. Do not crush or chew. Take your doses at regular intervals. Do not take your medicine more often than directed. Do not stop taking except on the advice of your doctor or health care professional. Talk to your pediatrician regarding the use of this medicine in children. Special care may be needed. Overdosage: If you think you have taken too much of this medicine contact a poison control center or emergency room at once. NOTE: This medicine is only for you. Do not share this medicine with others. What if I miss a dose? If you miss a dose, take it as soon as you can. If it is almost time for your next dose, take only that dose. Do not take double or extra doses. What may interact with this medicine? Do not take this medicine with any of the following medications: -feverfew -phenothiazines like chlorpromazine, mesoridazine, prochlorperazine, thioridazine This medicine may also interact with the following medications: -aluminum hydroxide gel -antipyrine -antiviral medicines for HIV or AIDS -barbiturates like phenobarbital -certain medicines for blood pressure, heart disease, irregular heart beat -cimetidine -ciprofloxacin -diazepam -fluconazole -haloperidol -isoniazid -medicines for cholesterol like cholestyramine or colestipol -medicines for mental depression -medicines for migraine headache like almotriptan, eletriptan, frovatriptan, naratriptan, rizatriptan,  sumatriptan, zolmitriptan -NSAIDs, medicines for pain and inflammation, like ibuprofen or naproxen -phenytoin -rifampin -teniposide -theophylline -thyroid medicines -tolbutamide -warfarin -zileuton This list may not describe all possible interactions. Give your health care provider a list of all the medicines, herbs, non-prescription drugs, or dietary supplements you use. Also tell them if you smoke, drink alcohol, or use illegal drugs. Some items may interact with your medicine. What should I watch for while using this medicine? Visit your doctor or health care professional for regular check ups. Contact your doctor right away if your symptoms worsen. Check your blood pressure and pulse rate regularly. Ask your health care professional what your blood pressure and pulse rate should be, and when you should contact them. Do not stop taking this medicine suddenly. This could lead to serious heart-related effects. You may get drowsy or dizzy. Do not drive, use machinery, or do anything that needs mental alertness until you know how this  drug affects you. Do not stand or sit up quickly, especially if you are an older patient. This reduces the risk of dizzy or fainting spells. Alcohol can make you more drowsy and dizzy. Avoid alcoholic drinks. This medicine can affect blood sugar levels. If you have diabetes, check with your doctor or health care professional before you change your diet or the dose of your diabetic medicine. Do not treat yourself for coughs, colds, or pain while you are taking this medicine without asking your doctor or health care professional for advice. Some ingredients may increase your blood pressure. What side effects may I notice from receiving this medicine? Side effects that you should report to your doctor or health care professional as soon as possible: -allergic reactions like skin rash, itching or hives, swelling of the face, lips, or tongue -breathing problems -changes  in blood sugar -cold hands or feet -difficulty sleeping, nightmares -dry peeling skin -hallucinations -muscle cramps or weakness -slow heart rate -swelling of the legs and ankles -vomiting Side effects that usually do not require medical attention (report to your doctor or health care professional if they continue or are bothersome): -change in sex drive or performance -diarrhea -dry sore eyes -hair loss -nausea -weak or tired This list may not describe all possible side effects. Call your doctor for medical advice about side effects. You may report side effects to FDA at 1-800-FDA-1088. Where should I keep my medicine? Keep out of the reach of children. Store at room temperature between 15 and 30 degrees C (59 and 86 degrees F). Protect from light, moisture and freezing. Keep container tightly closed. Throw away any unused medicine after the expiration date. NOTE: This sheet is a summary. It may not cover all possible information. If you have questions about this medicine, talk to your doctor, pharmacist, or health care provider.  2018 Elsevier/Gold Standard (2012-10-30 14:58:56)  Topiramate tablets What is this medicine? TOPIRAMATE (toe PYRE a mate) is used to treat seizures in adults or children with epilepsy. It is also used for the prevention of migraine headaches. This medicine may be used for other purposes; ask your health care provider or pharmacist if you have questions. COMMON BRAND NAME(S): Topamax, Topiragen What should I tell my health care provider before I take this medicine? They need to know if you have any of these conditions: -bleeding disorders -cirrhosis of the liver or liver disease -diarrhea -glaucoma -kidney stones or kidney disease -low blood counts, like low white cell, platelet, or red cell counts -lung disease like asthma, obstructive pulmonary disease, emphysema -metabolic acidosis -on a ketogenic diet -schedule for surgery or a  procedure -suicidal thoughts, plans, or attempt; a previous suicide attempt by you or a family member -an unusual or allergic reaction to topiramate, other medicines, foods, dyes, or preservatives -pregnant or trying to get pregnant -breast-feeding How should I use this medicine? Take this medicine by mouth with a glass of water. Follow the directions on the prescription label. Do not crush or chew. You may take this medicine with meals. Take your medicine at regular intervals. Do not take it more often than directed. Talk to your pediatrician regarding the use of this medicine in children. Special care may be needed. While this drug may be prescribed for children as young as 66 years of age for selected conditions, precautions do apply. Overdosage: If you think you have taken too much of this medicine contact a poison control center or emergency room at once. NOTE: This medicine is  only for you. Do not share this medicine with others. What if I miss a dose? If you miss a dose, take it as soon as you can. If your next dose is to be taken in less than 6 hours, then do not take the missed dose. Take the next dose at your regular time. Do not take double or extra doses. What may interact with this medicine? Do not take this medicine with any of the following medications: -probenecid This medicine may also interact with the following medications: -acetazolamide -alcohol -amitriptyline -aspirin and aspirin-like medicines -birth control pills -certain medicines for depression -certain medicines for seizures -certain medicines that treat or prevent blood clots like warfarin, enoxaparin, dalteparin, apixaban, dabigatran, and rivaroxaban -digoxin -hydrochlorothiazide -lithium -medicines for pain, sleep, or muscle relaxation -metformin -methazolamide -NSAIDS, medicines for pain and inflammation, like ibuprofen or naproxen -pioglitazone -risperidone This list may not describe all possible  interactions. Give your health care provider a list of all the medicines, herbs, non-prescription drugs, or dietary supplements you use. Also tell them if you smoke, drink alcohol, or use illegal drugs. Some items may interact with your medicine. What should I watch for while using this medicine? Visit your doctor or health care professional for regular checks on your progress. Do not stop taking this medicine suddenly. This increases the risk of seizures if you are using this medicine to control epilepsy. Wear a medical identification bracelet or chain to say you have epilepsy or seizures, and carry a card that lists all your medicines. This medicine can decrease sweating and increase your body temperature. Watch for signs of deceased sweating or fever, especially in children. Avoid extreme heat, hot baths, and saunas. Be careful about exercising, especially in hot weather. Contact your health care provider right away if you notice a fever or decrease in sweating. You should drink plenty of fluids while taking this medicine. If you have had kidney stones in the past, this will help to reduce your chances of forming kidney stones. If you have stomach pain, with nausea or vomiting and yellowing of your eyes or skin, call your doctor immediately. You may get drowsy, dizzy, or have blurred vision. Do not drive, use machinery, or do anything that needs mental alertness until you know how this medicine affects you. To reduce dizziness, do not sit or stand up quickly, especially if you are an older patient. Alcohol can increase drowsiness and dizziness. Avoid alcoholic drinks. If you notice blurred vision, eye pain, or other eye problems, seek medical attention at once for an eye exam. The use of this medicine may increase the chance of suicidal thoughts or actions. Pay special attention to how you are responding while on this medicine. Any worsening of mood, or thoughts of suicide or dying should be reported to  your health care professional right away. This medicine may increase the chance of developing metabolic acidosis. If left untreated, this can cause kidney stones, bone disease, or slowed growth in children. Symptoms include breathing fast, fatigue, loss of appetite, irregular heartbeat, or loss of consciousness. Call your doctor immediately if you experience any of these side effects. Also, tell your doctor about any surgery you plan on having while taking this medicine since this may increase your risk for metabolic acidosis. Birth control pills may not work properly while you are taking this medicine. Talk to your doctor about using an extra method of birth control. Women who become pregnant while using this medicine may enroll in the [place]  Antiepileptic Drug Pregnancy Registry by calling [phone]. This registry collects information about the safety of antiepileptic drug use during pregnancy. What side effects may I notice from receiving this medicine? Side effects that you should report to your doctor or health care professional as soon as possible: -allergic reactions like skin rash, itching or hives, swelling of the face, lips, or tongue -decreased sweating and/or rise in body temperature -depression -difficulty breathing, fast or irregular breathing patterns -difficulty speaking -difficulty walking or controlling muscle movements -hearing impairment -redness, blistering, peeling or loosening of the skin, including inside the mouth -tingling, pain or numbness in the hands or feet -unusual bleeding or bruising -unusually weak or tired -worsening of mood, thoughts or actions of suicide or dying Side effects that usually do not require medical attention (report to your doctor or health care professional if they continue or are bothersome): -altered taste -back pain, joint or muscle aches and pains -diarrhea, or constipation -headache -loss of appetite -nausea -stomach upset,  indigestion -tremors This list may not describe all possible side effects. Call your doctor for medical advice about side effects. You may report side effects to FDA at 1-800-FDA-1088. Where should I keep my medicine? Keep out of the reach of children. Store at room temperature between 15 and 30 degrees C (59 and 86 degrees F) in a tightly closed container. Protect from moisture. Throw away any unused medicine after the expiration date. NOTE: This sheet is a summary. It may not cover all possible information. If you have questions about this medicine, talk to your doctor, pharmacist, or health care provider.  2018 Elsevier/Gold Standard (2013-03-01 23:17:57)

## 2016-10-10 ENCOUNTER — Encounter: Payer: BC Managed Care – PPO | Admitting: Obstetrics and Gynecology

## 2016-10-24 ENCOUNTER — Encounter: Payer: Self-pay | Admitting: Obstetrics and Gynecology

## 2016-10-24 ENCOUNTER — Ambulatory Visit (INDEPENDENT_AMBULATORY_CARE_PROVIDER_SITE_OTHER): Payer: BC Managed Care – PPO | Admitting: Obstetrics and Gynecology

## 2016-10-24 VITALS — BP 112/73 | HR 72 | Ht 60.0 in | Wt 103.4 lb

## 2016-10-24 DIAGNOSIS — R922 Inconclusive mammogram: Secondary | ICD-10-CM

## 2016-10-24 DIAGNOSIS — N952 Postmenopausal atrophic vaginitis: Secondary | ICD-10-CM

## 2016-10-24 DIAGNOSIS — Z01419 Encounter for gynecological examination (general) (routine) without abnormal findings: Secondary | ICD-10-CM

## 2016-10-24 DIAGNOSIS — N941 Unspecified dyspareunia: Secondary | ICD-10-CM

## 2016-10-24 DIAGNOSIS — R9230 Dense breasts, unspecified: Secondary | ICD-10-CM

## 2016-10-24 HISTORY — DX: Postmenopausal atrophic vaginitis: N95.2

## 2016-10-24 HISTORY — DX: Inconclusive mammogram: R92.2

## 2016-10-24 HISTORY — DX: Unspecified dyspareunia: N94.10

## 2016-10-24 HISTORY — DX: Dense breasts, unspecified: R92.30

## 2016-10-24 MED ORDER — PRASTERONE 6.5 MG VA INST: 1.0000 | VAGINAL_INSERT | Freq: Every day | VAGINAL | 11 refills | Status: DC

## 2016-10-24 NOTE — Progress Notes (Signed)
Subjective:   Jeanne Ramos is a 51 y.o. G29P3002 Caucasian female here for a routine well-woman exam.  No LMP recorded. Patient is postmenopausal.    Current complaints: none, job is stressful. Reports also worsening painful sex, tried lots of different lubricants with no success. Not a candidate for ERT due to increased breast cancer risks.  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 PCP: [facility]       Does need labs  Social History: Sexual: heterosexual Marital Status: married Living situation: with family Occupation: Runner, broadcasting/film/video Tobacco/alcohol: no tobacco use Illicit drugs: no history of illicit drug use  The following portions of the patient's history were reviewed and updated as appropriate: allergies, current medications, past family history, past medical history, past social history, past surgical history and problem list.  Past Medical History Past Medical History:  Diagnosis Date  . Allergy   . Anxiety   . Depression   . Frequent headaches   . Irregular menses   . Migraines     Past Surgical History Past Surgical History:  Procedure Laterality Date  . CESAREAN SECTION  [phone]  . TUBAL LIGATION      Gynecologic History [id number]  No LMP recorded. Patient is postmenopausal. Contraception: post menopausal status Last Pap: 2016. Results were: normal Last mammogram: 2017. Results were: normal  Obstetric History OB History  Gravida Para Term Preterm AB Living  3 3 3     2   SAB TAB Ectopic Multiple Live Births               # Outcome Date GA Lbr Len/2nd Weight Sex Delivery Anes PTL Lv  3 Term           2 Term           1 Term               Current Medications Current Outpatient Prescriptions on File Prior to Visit  Medication Sig Dispense Refill  . cholecalciferol (VITAMIN D) 1000 units tablet Take 1,000 Units by mouth daily.    Marland Kitchen aspirin-acetaminophen-caffeine (EXCEDRIN MIGRAINE) 250-250-65 MG tablet Take 1 tablet by mouth daily as needed.     No  current facility-administered medications on file prior to visit.     Review of Systems Patient denies any headaches, blurred vision, shortness of breath, chest pain, abdominal pain, problems with bowel movements, urination, or intercourse.  Objective:  BP 112/73   Pulse 72   Ht 5' (1.524 m)   Wt 103 lb 6.4 oz (46.9 kg)   BMI 20.19 kg/m  Physical Exam  General:  Well developed, well nourished, no acute distress. She is alert and oriented x3. Skin:  Warm and dry Neck:  Midline trachea, no thyromegaly or nodules Cardiovascular: Regular rate and rhythm, no murmur heard Lungs:  Effort normal, all lung fields clear to auscultation bilaterally Breasts:  No dominant palpable mass, retraction, or nipple discharge Abdomen:  Soft, non tender, no hepatosplenomegaly or masses Pelvic:  External genitalia is normal in appearance.  The vagina is normal in appearance. The cervix is bulbous, no CMT.  Thin prep pap is not done. Uterus is felt to be normal size, shape, and contour.  No adnexal masses or tenderness noted. Extremities:  No swelling or varicosities noted Psych:  She has a normal mood and affect  Assessment:   Healthy well-woman exam Vaginal atrophy dysparenia   Plan:  Will try Intrarosa 6.5mg  vaginal inserts. Labs obtained and will follow up accordingly. F/U 1 year for AE, or  sooner if needed Mammogram ordered or sooner if problems   Melody Suzan NailerN Shambley, CNM

## 2016-10-25 LAB — COMPREHENSIVE METABOLIC PANEL
ALK PHOS: 38 IU/L — AB (ref 39–117)
ALT: 16 IU/L (ref 0–32)
AST: 19 IU/L (ref 0–40)
Albumin/Globulin Ratio: 2.6 — ABNORMAL HIGH (ref 1.2–2.2)
Albumin: 4.6 g/dL (ref 3.5–5.5)
BUN/Creatinine Ratio: 18 (ref 9–23)
BUN: 14 mg/dL (ref 6–24)
Bilirubin Total: 0.3 mg/dL (ref 0.0–1.2)
CHLORIDE: 102 mmol/L (ref 96–106)
CO2: 25 mmol/L (ref 20–29)
CREATININE: 0.77 mg/dL (ref 0.57–1.00)
Calcium: 9.4 mg/dL (ref 8.7–10.2)
GFR calc Af Amer: 103 mL/min/{1.73_m2} (ref >59)
GFR calc non Af Amer: 90 mL/min/{1.73_m2} (ref >59)
GLOBULIN, TOTAL: 1.8 g/dL (ref 1.5–4.5)
Glucose: 85 mg/dL (ref 65–99)
POTASSIUM: 4.6 mmol/L (ref 3.5–5.2)
SODIUM: 138 mmol/L (ref 134–144)
Total Protein: 6.4 g/dL (ref 6.0–8.5)

## 2016-10-25 LAB — MAGNESIUM: MAGNESIUM: 2 mg/dL (ref 1.6–2.3)

## 2016-10-25 LAB — LIPID PANEL
CHOLESTEROL TOTAL: 191 mg/dL (ref 100–199)
Chol/HDL Ratio: 3.9 ratio (ref 0.0–4.4)
HDL: 49 mg/dL (ref >39)
LDL CALC: 126 mg/dL — AB (ref 0–99)
Triglycerides: 82 mg/dL (ref 0–149)
VLDL CHOLESTEROL CAL: 16 mg/dL (ref 5–40)

## 2016-10-25 LAB — TSH: TSH: 2.26 u[IU]/mL (ref 0.450–4.500)

## 2016-10-25 LAB — VITAMIN D 25 HYDROXY (VIT D DEFICIENCY, FRACTURES): VIT D 25 HYDROXY: 36.8 ng/mL (ref 30.0–100.0)

## 2016-11-29 ENCOUNTER — Telehealth: Payer: Self-pay | Admitting: Family

## 2016-11-29 NOTE — Telephone Encounter (Signed)
Patient is requesting a RX for valium for her dentist appointment. The dentist will not give the rx, told her she could ask her pcp. She has only seen you once in May 2018. Please advise. Thank you.

## 2016-11-29 NOTE — Telephone Encounter (Signed)
Pt called regarding this , was informed Tammy Sours has the message and  we have a 24-48 Rx request, her appt with the dentists is Tuesday 9/25 Please advise

## 2016-12-02 MED ORDER — DIAZEPAM 5 MG PO TABS: 5.0000 mg | ORAL_TABLET | Freq: Three times a day (TID) | ORAL | 0 refills | Status: DC | PRN

## 2016-12-02 NOTE — Telephone Encounter (Signed)
Medication printed to be faxed.  

## 2016-12-02 NOTE — Telephone Encounter (Signed)
Rx faxed

## 2016-12-02 NOTE — Telephone Encounter (Signed)
LVM informing patient.

## 2017-05-30 ENCOUNTER — Encounter: Payer: Self-pay | Admitting: Emergency Medicine

## 2017-05-30 ENCOUNTER — Other Ambulatory Visit: Payer: Self-pay

## 2017-05-30 DIAGNOSIS — K8020 Calculus of gallbladder without cholecystitis without obstruction: Secondary | ICD-10-CM | POA: Insufficient documentation

## 2017-05-30 DIAGNOSIS — Z79899 Other long term (current) drug therapy: Secondary | ICD-10-CM | POA: Diagnosis not present

## 2017-05-30 DIAGNOSIS — R1084 Generalized abdominal pain: Secondary | ICD-10-CM | POA: Diagnosis present

## 2017-05-30 LAB — COMPREHENSIVE METABOLIC PANEL
ALK PHOS: 32 U/L — AB (ref 38–126)
ALT: 18 U/L (ref 14–54)
AST: 21 U/L (ref 15–41)
Albumin: 4.1 g/dL (ref 3.5–5.0)
Anion gap: 9 (ref 5–15)
BUN: 18 mg/dL (ref 6–20)
CALCIUM: 9 mg/dL (ref 8.9–10.3)
CHLORIDE: 106 mmol/L (ref 101–111)
CO2: 25 mmol/L (ref 22–32)
Creatinine, Ser: 0.85 mg/dL (ref 0.44–1.00)
GFR calc non Af Amer: >60 mL/min (ref >60)
GLUCOSE: 105 mg/dL — AB (ref 65–99)
Potassium: 3.5 mmol/L (ref 3.5–5.1)
SODIUM: 140 mmol/L (ref 135–145)
Total Bilirubin: 0.7 mg/dL (ref 0.3–1.2)
Total Protein: 6.8 g/dL (ref 6.5–8.1)

## 2017-05-30 LAB — URINALYSIS, COMPLETE (UACMP) WITH MICROSCOPIC
Bacteria, UA: NONE SEEN
Bilirubin Urine: NEGATIVE
GLUCOSE, UA: NEGATIVE mg/dL
HGB URINE DIPSTICK: NEGATIVE
Ketones, ur: NEGATIVE mg/dL
Leukocytes, UA: NEGATIVE
Nitrite: NEGATIVE
PH: 7 (ref 5.0–8.0)
Protein, ur: NEGATIVE mg/dL
Specific Gravity, Urine: 1.016 (ref 1.005–1.030)

## 2017-05-30 LAB — LIPASE, BLOOD: Lipase: 39 U/L (ref 11–51)

## 2017-05-30 LAB — CBC
HCT: 37.7 % (ref 35.0–47.0)
Hemoglobin: 12.7 g/dL (ref 12.0–16.0)
MCH: 29.7 pg (ref 26.0–34.0)
MCHC: 33.8 g/dL (ref 32.0–36.0)
MCV: 88.1 fL (ref 80.0–100.0)
PLATELETS: 245 10*3/uL (ref 150–440)
RBC: 4.28 MIL/uL (ref 3.80–5.20)
RDW: 12.9 % (ref 11.5–14.5)
WBC: 6.2 10*3/uL (ref 3.6–11.0)

## 2017-05-30 NOTE — ED Triage Notes (Signed)
Pt arrives POV to triage with c/o upper abdominal pain. Pt states that she feels "like she has a band around her abdomen". Pt is in NAD.

## 2017-05-31 ENCOUNTER — Emergency Department: Payer: BC Managed Care – PPO

## 2017-05-31 ENCOUNTER — Emergency Department
Admission: EM | Admit: 2017-05-31 | Discharge: 2017-05-31 | Disposition: A | Discharge status: Home / Self Care | Payer: BC Managed Care – PPO | Attending: Emergency Medicine | Admitting: Emergency Medicine

## 2017-05-31 DIAGNOSIS — R1084 Generalized abdominal pain: Secondary | ICD-10-CM

## 2017-05-31 DIAGNOSIS — K8020 Calculus of gallbladder without cholecystitis without obstruction: Secondary | ICD-10-CM

## 2017-05-31 DIAGNOSIS — R109 Unspecified abdominal pain: Secondary | ICD-10-CM

## 2017-05-31 LAB — TROPONIN I: Troponin I: <0.03 ng/mL (ref <0.03)

## 2017-05-31 MED ORDER — SODIUM CHLORIDE 0.9 % IV BOLUS (SEPSIS)
1000.0000 mL | Freq: Once | INTRAVENOUS | Status: AC
  Administered 2017-05-31: 1000 mL via INTRAVENOUS

## 2017-05-31 MED ORDER — OXYCODONE-ACETAMINOPHEN 5-325 MG PO TABS: 2.0000 | ORAL_TABLET | ORAL | 0 refills | Status: DC | PRN

## 2017-05-31 MED ORDER — IOPAMIDOL (ISOVUE-300) INJECTION 61%
75.0000 mL | Freq: Once | INTRAVENOUS | Status: AC | PRN
  Administered 2017-05-31: 75 mL via INTRAVENOUS

## 2017-05-31 MED ORDER — OXYCODONE-ACETAMINOPHEN 5-325 MG PO TABS
1.0000 | ORAL_TABLET | Freq: Once | ORAL | Status: AC
  Administered 2017-05-31: 1 via ORAL
  Filled 2017-05-31: qty 1

## 2017-05-31 MED ORDER — IOPAMIDOL (ISOVUE-300) INJECTION 61%
15.0000 mL | INTRAVENOUS | Status: AC
  Administered 2017-05-31 (×2): 15 mL via ORAL

## 2017-05-31 MED ORDER — MORPHINE SULFATE (PF) 4 MG/ML IV SOLN
4.0000 mg | Freq: Once | INTRAVENOUS | Status: AC
  Administered 2017-05-31: 4 mg via INTRAVENOUS
  Filled 2017-05-31: qty 1

## 2017-05-31 MED ORDER — ONDANSETRON HCL 4 MG/2ML IJ SOLN
4.0000 mg | Freq: Once | INTRAMUSCULAR | Status: AC
  Administered 2017-05-31: 4 mg via INTRAVENOUS
  Filled 2017-05-31: qty 2

## 2017-05-31 MED ORDER — ONDANSETRON 4 MG PO TBDP: 4.0000 mg | ORAL_TABLET | Freq: Three times a day (TID) | ORAL | 0 refills | Status: DC | PRN

## 2017-05-31 NOTE — Discharge Instructions (Addendum)
Please follow-up with surgery for further evaluation of your abdominal pain and cholelithiasis.  Please return with any fever, nausea, vomiting or any other concerns.

## 2017-05-31 NOTE — ED Notes (Signed)
Patient transported to Ultrasound 

## 2017-05-31 NOTE — ED Notes (Signed)
ED Provider at bedside. 

## 2017-05-31 NOTE — ED Provider Notes (Signed)
[facility] Emergency Department Provider Note   ____________________________________________   First MD Initiated Contact with Patient 05/31/17 0100     (approximate)  I have reviewed the triage vital signs and the nursing notes.   HISTORY  Chief Complaint Abdominal Pain    HPI Jeanne Ramos is a 51 y.o. female who comes into the hospital today with some abdominal pain around her mid abdomen.  The patient states that it started earlier today.  She was at a friend's home around noon when the pain started.  She states that it has just gotten worse and worse.  The patient has not had any nausea vomiting or diarrhea.  The patient also has not had any constipation.  She did not take any medication for her pain.  The patient rates her pain a 7 out of 10 in intensity currently.  She has never had this pain before.  The patient has not had any known sick contacts but she does work in the school system.  The patient denies any fevers.  The patient is here today for evaluation of her symptoms.  Past Medical History:  Diagnosis Date  . Allergy   . Anxiety   . Depression   . Frequent headaches   . Irregular menses   . Migraines     Patient Active Problem List   Diagnosis Date Noted  . Dyspareunia in female 10/24/2016  . Vaginal atrophy 10/24/2016  . Dense breast tissue on mammogram 10/24/2016  . Elevated blood pressure reading 07/19/2016  . Migraine headache with aura 07/19/2016    Past Surgical History:  Procedure Laterality Date  . CESAREAN SECTION  [phone]  . TUBAL LIGATION      Prior to Admission medications   Medication Sig Start Date End Date Taking? Authorizing Provider  aspirin-acetaminophen-caffeine (EXCEDRIN MIGRAINE) [phone] MG tablet Take 1 tablet by mouth daily as needed.    [provider]  cholecalciferol (VITAMIN D) 1000 units tablet Take 1,000 Units by mouth daily.    [provider]  diazepam (VALIUM) 5  MG tablet Take 1 tablet (5 mg total) by mouth every 8 (eight) hours as needed for anxiety. 12/02/16   Veryl Speak, FNP  ondansetron (ZOFRAN ODT) 4 MG disintegrating tablet Take 1 tablet (4 mg total) by mouth every 8 (eight) hours as needed for nausea or vomiting. 05/31/17   Rebecka Apley, MD  oxyCODONE-acetaminophen (PERCOCET/ROXICET) 5-325 MG tablet Take 2 tablets by mouth every 4 (four) hours as needed for severe pain. 05/31/17   Rebecka Apley, MD  Prasterone (INTRAROSA) 6.5 MG INST Place 1 Applicatorful vaginally at bedtime. 10/24/16   Shambley, Melody N, CNM    Allergies Meperidine  Family History  Problem Relation Age of Onset  . Breast cancer Sister 34  . Breast cancer Paternal Grandmother   . Heart disease Mother   . Heart disease Father     Social History Social History   Tobacco Use  . Smoking status: Never Smoker  . Smokeless tobacco: Never Used  Substance Use Topics  . Alcohol use: No  . Drug use: No    Review of Systems  Constitutional: No fever Eyes: No visual changes. ENT: No sore throat. Cardiovascular: Denies chest pain. Respiratory: Denies shortness of breath. Gastrointestinal:  abdominal pain.  No nausea, no vomiting.  No diarrhea.  No constipation. Genitourinary: Negative for dysuria. Musculoskeletal: Negative for back pain. Skin: Negative for rash. Neurological: Negative for headaches,   ____________________________________________  PHYSICAL EXAM:  VITAL SIGNS: ED Triage Vitals  Enc Vitals Group     BP 05/30/17 2129 136/86     Pulse Rate 05/30/17 2129 89     Resp 05/30/17 2129 16     Temp 05/30/17 2129 97.6 F (36.4 C)     Temp Source 05/30/17 2129 Oral     SpO2 05/30/17 2129 100 %     Weight 05/30/17 2138 110 lb (49.9 kg)     Height 05/30/17 2138 5' (1.524 m)     Head Circumference --      Peak Flow --      Pain Score 05/30/17 2154 8     Pain Loc --      Pain Edu? --      Excl. in GC? --     Constitutional: Alert and  oriented. Ill appearing and in moderate distress. Eyes: Conjunctivae are normal. PERRL. EOMI. Head: Atraumatic. Nose: No congestion/rhinnorhea. Mouth/Throat: Mucous membranes are moist.  Oropharynx non-erythematous. Cardiovascular: Normal rate, regular rhythm. Grossly normal heart sounds.  Good peripheral circulation. Respiratory: Normal respiratory effort.  No retractions. Lungs CTAB. Gastrointestinal: Soft with some diffuse tenderness to palpation and some guarding diffusely. No distention.  Positive bowel sounds Musculoskeletal: No lower extremity tenderness nor edema.   Neurologic:  Normal speech and language.  Skin:  Skin is warm, dry and intact.  Psychiatric: Mood and affect are normal.   ____________________________________________   LABS (all labs ordered are listed, but only abnormal results are displayed)  Labs Reviewed  COMPREHENSIVE METABOLIC PANEL - Abnormal; Notable for the following components:      Result Value   Glucose, Bld 105 (*)    Alkaline Phosphatase 32 (*)    All other components within normal limits  URINALYSIS, COMPLETE (UACMP) WITH MICROSCOPIC - Abnormal; Notable for the following components:   Color, Urine YELLOW (*)    APPearance CLEAR (*)    Squamous Epithelial / LPF 0-5 (*)    All other components within normal limits  LIPASE, BLOOD  CBC  TROPONIN I   ____________________________________________  EKG  ED ECG REPORT I, Rebecka Apley, the attending physician, personally viewed and interpreted this ECG.   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  Official radiology report(s): Ct Abdomen Pelvis W Contrast  Result Date: 05/31/2017 CLINICAL DATA:  Acute upper abdominal pain. EXAM: CT ABDOMEN AND PELVIS WITH CONTRAST TECHNIQUE: Multidetector CT imaging of the abdomen and pelvis was performed using the standard protocol following bolus administration of intravenous contrast. CONTRAST:  75mL ISOVUE-300 IOPAMIDOL (ISOVUE-300) INJECTION 61% COMPARISON:  None. FINDINGS: Lower chest: The lung bases are clear. Hepatobiliary: 5 mm hypodensity in the left hepatic lobe is too small to characterize. Calcified gallstones within physiologically distended gallbladder. No pericholecystic inflammation or biliary dilatation. Pancreas: No ductal dilatation or inflammation. Spleen: Normal in size without focal abnormality. Adrenals/Urinary Tract: Normal adrenal glands. Bilateral extrarenal pelvis configuration. No hydronephrosis or perinephric edema. Homogeneous renal enhancement with symmetric excretion on delayed phase imaging. Urinary bladder is physiologically distended without wall thickening. Stomach/Bowel: Stomach is within normal limits. Appendix appears normal, for example image 53. No evidence of bowel wall  thickening, distention, or inflammatory changes. Vascular/Lymphatic: No significant vascular findings are present. No enlarged abdominal or pelvic lymph nodes. Reproductive: Uterus and bilateral adnexa are unremarkable. Other: No free air, free fluid, or intra-abdominal fluid collection. Musculoskeletal: There are no acute or suspicious osseous abnormalities. IMPRESSION: 1. Gallstones without CT findings of cholecystitis. 2. 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 Electronically Signed   By: Rubye OaksMelanie  Ehinger M.D.   On: 05/31/2017 03:41   [place] Abdomen Limited Ruq  Result Date: 05/31/2017 CLINICAL DATA:  Initial evaluation for acute abdominal pain. EXAM: ULTRASOUND ABDOMEN LIMITED RIGHT UPPER  QUADRANT COMPARISON:  Prior CT earlier the same day. FINDINGS: Gallbladder: Multiple mobile shadowing stones present within the gallbladder lumen, largest of which measures 6 mm. Gallbladder wall measure within normal limits of 1.6 mm. No free pericholecystic fluid. No sonographic Murphy sign elicited on exam. Common bile duct: Diameter: 4.6 mm Liver: No focal lesion identified. Previously noted small hepatic cyst not visualized on this exam. Within normal limits in parenchymal echogenicity. Portal vein is patent on color Doppler imaging with normal direction of blood flow towards the liver. IMPRESSION: 1. Cholelithiasis. No sonographic features to suggest acute cholecystitis. 2. No biliary dilatation. Electronically Signed   By: Rise MuBenjamin  McClintock M.D.   On: 05/31/2017 04:55    ____________________________________________   PROCEDURES  Procedure(s) performed: None  Procedures  Critical Care performed: No  ____________________________________________   INITIAL IMPRESSION / ASSESSMENT AND PLAN / ED COURSE  As part of my medical decision making, I reviewed the following data within the electronic MEDICAL RECORD NUMBER Notes from prior ED visits and [place] Controlled Substance Database   This is a 52 year old female who comes into the hospital today with some diffuse mid abdominal pain.  My differential diagnosis includes appendicitis, colitis, pancreatitis, gastroenteritis, cholecystitis.  I did check some blood work on the patient to include a CBC CMP lipase and urinalysis.  The patient's blood work is unremarkable.  I will give the patient a liter of normal saline some morphine and Zofran.  I will add a troponin onto her blood work and I will send her for a CT scan of her abdomen and pelvis given her exquisite pain.  The patient will be reassessed.     The patient's pain is improved after the dose of morphine.  The CT scan showed cholelithiasis and I did confirm it with an ultrasound which  did not show any biliary dilatation or cholecystitis.  The patient will be discharged home to follow-up with surgery for removal of her gallbladder. ____________________________________________   FINAL CLINICAL IMPRESSION(S) / ED DIAGNOSES  Final diagnoses:  Abdominal pain  Generalized abdominal pain  Calculus of gallbladder without cholecystitis without obstruction     ED Discharge Orders        Ordered    oxyCODONE-acetaminophen (PERCOCET/ROXICET) 5-325 MG tablet  Every 4 hours PRN     05/31/17 0509    ondansetron (ZOFRAN ODT) 4 MG disintegrating tablet  Every 8 hours PRN     05/31/17 0509       Note:  This document was prepared using Dragon voice recognition software and may include unintentional dictation errors.    Rebecka ApleyWebster, Allison P, MD 05/31/17 [phone]

## 2017-05-31 NOTE — ED Notes (Signed)
Patient transported to CT 

## 2017-06-02 DIAGNOSIS — J302 Other seasonal allergic rhinitis: Secondary | ICD-10-CM | POA: Insufficient documentation

## 2017-06-02 HISTORY — DX: Other seasonal allergic rhinitis: J30.2

## 2017-06-04 ENCOUNTER — Ambulatory Visit: Payer: BC Managed Care – PPO | Admitting: Surgery

## 2017-06-04 ENCOUNTER — Encounter: Payer: Self-pay | Admitting: Surgery

## 2017-06-04 VITALS — BP 147/93 | HR 108 | Temp 98.1°F | Ht 61.0 in | Wt 99.6 lb

## 2017-06-04 DIAGNOSIS — K8020 Calculus of gallbladder without cholecystitis without obstruction: Secondary | ICD-10-CM

## 2017-06-04 MED ORDER — CEFAZOLIN SODIUM-DEXTROSE 2-4 GM/100ML-% IV SOLN
2.0000 g | INTRAVENOUS | Status: AC
  Administered 2017-06-05: 2 g via INTRAVENOUS

## 2017-06-04 NOTE — Progress Notes (Signed)
Surgical Consultation  06/04/2017  Jeanne Ramos is an 52 y.o. female.   Chief Complaint  Patient presents with  . New Patient (Initial Visit)    Calculas of gallbladder   HPI: 52 year old female seen in consultation at the request of Dr. Zenda Alpers.  She reports that over the last 4-5 days she is to be having significant abdominal pain mainly in the right upper quadrant.  Pain is intermittent sharp and moderate to severe intensity.  No specific aggravating or alleviating factors.  No fevers, no chills no evidence of biliary obstruction.  Only surgical history C-sections x3.  She is able to perform more than 6 METs of activity without any shortness of breath or chest pain.  He did have some nausea but no vomiting or diarrhea. She did have a recent CT scan as well as an ultrasound that I have personally reviewed showing evidence of gallstones no evidence of cholecystitis.  Normal common bile duct.  Normal LFTs and normal creatinine.  Past Medical History:  Diagnosis Date  . Allergy   . Anxiety   . Dense breast tissue on mammogram 10/24/2016  . Depression   . Dyspareunia in female 10/24/2016  . Elevated blood pressure reading 07/19/2016  . Frequent headaches   . Irregular menses   . Migraine headache with aura 07/19/2016  . Migraines   . Seasonal allergic rhinitis 06/02/2017  . Situational mixed anxiety and depressive disorder 12/22/2014  . Vaginal atrophy 10/24/2016    Past Surgical History:  Procedure Laterality Date  . CESAREAN SECTION  [id number]  . TUBAL LIGATION      Family History  Problem Relation Age of Onset  . Breast cancer Sister 5  . Breast cancer Paternal Grandmother   . Heart disease Mother   . Heart disease Father     Social History:  reports that she has never smoked. She has never used smokeless tobacco. She reports that she does not drink alcohol or use drugs.  Allergies:  Allergies  Allergen Reactions  . Meperidine Swelling    Medications  reviewed.     ROS Full ROS performed and is otherwise negative other than what is stated in the HPI    BP (!) 147/93   Pulse (!) 108   Temp 98.1 F (36.7 C) (Oral)   Ht 5\' 1"  (1.549 m)   Wt 45.2 kg (99 lb 9.6 oz)   BMI 18.82 kg/m   Physical Exam  Constitutional: She is oriented to person, place, and time and well-developed, well-nourished, and in no distress. No distress.  Eyes: Right eye exhibits no discharge. Left eye exhibits no discharge. No scleral icterus.  Neck: Normal range of motion. No JVD present. No tracheal deviation present.  Cardiovascular: Normal rate, regular rhythm and normal heart sounds.  Pulmonary/Chest: Effort normal. No stridor. No respiratory distress. She has no wheezes. She has no rales.  Abdominal: Soft. She exhibits no distension and no mass. There is tenderness. There is no rebound and no guarding.  RUQ tenderness w/o peritonitis  Musculoskeletal: Normal range of motion.  Neurological: She is alert and oriented to person, place, and time. Gait normal. GCS score is 15.  Skin: Skin is warm and dry. She is not diaphoretic.  Psychiatric: Mood, memory, affect and judgment normal.  Nursing note and vitals reviewed.   Assessment/Plan: 1. Calculus of gallbladder without cholecystitis without obstruction 52 year old with persistent symptomatic cholelithiasis versus chronic cholecystitis.   No evidence of biliary obstruction. Discussed with the patient in detail about  the options and I do recommend cholecystectomy. The risks, benefits, complications, treatment options, and expected outcomes were discussed with the patient. The possibilities of bleeding, recurrent infection, finding a normal gallbladder, perforation of viscus organs, damage to surrounding structures, bile leak, abscess formation, needing a drain placed, the need for additional procedures, reaction to medication, pulmonary aspiration,  failure to diagnose a condition, the possible need to  convert to an open procedure, and creating a complication requiring transfusion or operation were discussed with the patient. The patient and/or family concurred with the proposed plan, giving informed consent.  Copy of this report will be sent to the referring provider  Sterling Bigiego Pabon, MD [facility] General Surgeon

## 2017-06-04 NOTE — Patient Instructions (Signed)
Please arrive at pre-admit at 10:00 am . Nothing to eat or drink after midnight tonight.  Please do not take any medications the morning of surgery.    You have requested to have your gallbladder removed. This will be done on 06/05/17 at [facility] with Dr. Sterling Big.  You will most likely be out of work 1-2 weeks for this surgery. You will return after your post-op appointment with a lifting restriction for approximately 4 more weeks.  You will be able to eat anything you would like to following surgery. But, start by eating a bland diet and advance this as tolerated. The Gallbladder diet is below, please go as closely by this diet as possible prior to surgery to avoid any further attacks.  Please see the (blue)pre-care form that you have been given today. If you have any questions, please call our office.  Laparoscopic Cholecystectomy Laparoscopic cholecystectomy is surgery to remove the gallbladder. The gallbladder is located in the upper right part of the abdomen, behind the liver. It is a storage sac for bile, which is produced in the liver. Bile aids in the digestion and absorption of fats. Cholecystectomy is often done for inflammation of the gallbladder (cholecystitis). This condition is usually caused by a buildup of gallstones (cholelithiasis) in the gallbladder. Gallstones can block the flow of bile, and that can result in inflammation and pain. In severe cases, emergency surgery may be required. If emergency surgery is not required, you will have time to prepare for the procedure. Laparoscopic surgery is an alternative to open surgery. Laparoscopic surgery has a shorter recovery time. Your common bile duct may also need to be examined during the procedure. If stones are found in the common bile duct, they may be removed. LET [facility] CARE PROVIDER KNOW ABOUT:  Any allergies you have.  All medicines you are taking, including vitamins, herbs, eye drops, creams, and  over-the-counter medicines.  Previous problems you or members of your family have had with the use of anesthetics.  Any blood disorders you have.  Previous surgeries you have had.    Any medical conditions you have. RISKS AND COMPLICATIONS Generally, this is a safe procedure. However, problems may occur, including:  Infection.  Bleeding.  Allergic reactions to medicines.  Damage to other structures or organs.  A stone remaining in the common bile duct.  A bile leak from the cyst duct that is clipped when your gallbladder is removed.  The need to convert to open surgery, which requires a larger incision in the abdomen. This may be necessary if your surgeon thinks that it is not safe to continue with a laparoscopic procedure. BEFORE THE PROCEDURE  Ask your health care provider about:  Changing or stopping your regular medicines. This is especially important if you are taking diabetes medicines or blood thinners.  Taking medicines such as aspirin and ibuprofen. These medicines can thin your blood. Do not take these medicines before your procedure if your health care provider instructs you not to.  Follow instructions from your health care provider about eating or drinking restrictions.  Let your health care provider know if you develop a cold or an infection before surgery.  Plan to have someone take you home after the procedure.  Ask your health care provider how your surgical site will be marked or identified.  You may be given antibiotic medicine to help prevent infection. PROCEDURE  To reduce your risk of infection:  Your health care team will wash or sanitize  their hands.  Your skin will be washed with soap.  An IV tube may be inserted into one of your veins.  You will be given a medicine to make you fall asleep (general anesthetic).  A breathing tube will be placed in your mouth.  The surgeon will make several small cuts (incisions) in your abdomen.  A  thin, lighted tube (laparoscope) that has a tiny camera on the end will be inserted through one of the small incisions. The camera on the laparoscope will send a picture to a TV screen (monitor) in the operating room. This will give the surgeon a good view inside your abdomen.  A gas will be pumped into your abdomen. This will expand your abdomen to give the surgeon more room to perform the surgery.  Other tools that are needed for the procedure will be inserted through the other incisions. The gallbladder will be removed through one of the incisions.  After your gallbladder has been removed, the incisions will be closed with stitches (sutures), staples, or skin glue.  Your incisions may be covered with a bandage (dressing). The procedure may vary among health care providers and hospitals. AFTER THE PROCEDURE  Your blood pressure, heart rate, breathing rate, and blood oxygen level will be monitored often until the medicines you were given have worn off.  You will be given medicines as needed to control your pain.   This information is not intended to replace advice given to you by your health care provider. Make sure you discuss any questions you have with your health care provider.   Document Released: 02/25/2005 Document Revised: 11/16/2014 Document Reviewed: 10/07/2012 Elsevier Interactive Patient Education 2016 Elsevier Inc.   Low-Fat Diet for Gallbladder Conditions A low-fat diet can be helpful if you have pancreatitis or a gallbladder condition. With these conditions, your pancreas and gallbladder have trouble digesting fats. A healthy eating plan with less fat will help rest your pancreas and gallbladder and reduce your symptoms. WHAT DO I NEED TO KNOW ABOUT THIS DIET?  Eat a low-fat diet.  Reduce your fat intake to less than 20-30% of your total daily calories. This is less than 50-60 g of fat per day.  Remember that you need some fat in your diet. Ask your dietician what your  daily goal should be.  Choose nonfat and low-fat healthy foods. Look for the words "nonfat," "low fat," or "fat free."  As a guide, look on the label and choose foods with less than 3 g of fat per serving. Eat only one serving.  Avoid alcohol.  Do not smoke. If you need help quitting, talk with your health care provider.  Eat small frequent meals instead of three large heavy meals. WHAT FOODS CAN I EAT? Grains Include healthy grains and starches such as potatoes, wheat bread, fiber-rich cereal, and brown rice. Choose whole grain options whenever possible. In adults, whole grains should account for 45-65% of your daily calories.  Fruits and Vegetables Eat plenty of fruits and vegetables. Fresh fruits and vegetables add fiber to your diet. Meats and Other Protein Sources Eat lean meat such as chicken and pork. Trim any fat off of meat before cooking it. Eggs, fish, and beans are other sources of protein. In adults, these foods should account for 10-35% of your daily calories. Dairy Choose low-fat milk and dairy options. Dairy includes fat and protein, as well as calcium.  Fats and Oils Limit high-fat foods such as fried foods, sweets, baked goods, sugary drinks.  Other Creamy sauces and condiments, such as mayonnaise, can add extra fat. Think about whether or not you need to use them, or use smaller amounts or low fat options. WHAT FOODS ARE NOT RECOMMENDED?  High fat foods, such as:  [employer].  Ice cream.  [place] toast.  Sweet rolls.  Pizza.  Cheese bread.  Foods covered with batter, butter, creamy sauces, or cheese.  Fried foods.  Sugary drinks and desserts.  Foods that cause gas or bloating   This information is not intended to replace advice given to you by your health care provider. Make sure you discuss any questions you have with your health care provider.   Document Released: 03/02/2013 Document Reviewed: 03/02/2013 Elsevier Interactive Patient Education  [employer]

## 2017-06-04 NOTE — H&P (View-Only) (Signed)
Surgical Consultation  06/04/2017  Jeanne Ramos is an 52 y.o. female.   Chief Complaint  Patient presents with  . New Patient (Initial Visit)    Calculas of gallbladder   HPI: 52 year old female seen in consultation at the request of Dr. Zenda Alpers.  She reports that over the last 4-5 days she is to be having significant abdominal pain mainly in the right upper quadrant.  Pain is intermittent sharp and moderate to severe intensity.  No specific aggravating or alleviating factors.  No fevers, no chills no evidence of biliary obstruction.  Only surgical history C-sections x3.  She is able to perform more than 6 METs of activity without any shortness of breath or chest pain.  He did have some nausea but no vomiting or diarrhea. She did have a recent CT scan as well as an ultrasound that I have personally reviewed showing evidence of gallstones no evidence of cholecystitis.  Normal common bile duct.  Normal LFTs and normal creatinine.  Past Medical History:  Diagnosis Date  . Allergy   . Anxiety   . Dense breast tissue on mammogram 10/24/2016  . Depression   . Dyspareunia in female 10/24/2016  . Elevated blood pressure reading 07/19/2016  . Frequent headaches   . Irregular menses   . Migraine headache with aura 07/19/2016  . Migraines   . Seasonal allergic rhinitis 06/02/2017  . Situational mixed anxiety and depressive disorder 12/22/2014  . Vaginal atrophy 10/24/2016    Past Surgical History:  Procedure Laterality Date  . CESAREAN SECTION  [id number]  . TUBAL LIGATION      Family History  Problem Relation Age of Onset  . Breast cancer Sister 5  . Breast cancer Paternal Grandmother   . Heart disease Mother   . Heart disease Father     Social History:  reports that she has never smoked. She has never used smokeless tobacco. She reports that she does not drink alcohol or use drugs.  Allergies:  Allergies  Allergen Reactions  . Meperidine Swelling    Medications  reviewed.     ROS Full ROS performed and is otherwise negative other than what is stated in the HPI    BP (!) 147/93   Pulse (!) 108   Temp 98.1 F (36.7 C) (Oral)   Ht 5\' 1"  (1.549 m)   Wt 45.2 kg (99 lb 9.6 oz)   BMI 18.82 kg/m   Physical Exam  Constitutional: She is oriented to person, place, and time and well-developed, well-nourished, and in no distress. No distress.  Eyes: Right eye exhibits no discharge. Left eye exhibits no discharge. No scleral icterus.  Neck: Normal range of motion. No JVD present. No tracheal deviation present.  Cardiovascular: Normal rate, regular rhythm and normal heart sounds.  Pulmonary/Chest: Effort normal. No stridor. No respiratory distress. She has no wheezes. She has no rales.  Abdominal: Soft. She exhibits no distension and no mass. There is tenderness. There is no rebound and no guarding.  RUQ tenderness w/o peritonitis  Musculoskeletal: Normal range of motion.  Neurological: She is alert and oriented to person, place, and time. Gait normal. GCS score is 15.  Skin: Skin is warm and dry. She is not diaphoretic.  Psychiatric: Mood, memory, affect and judgment normal.  Nursing note and vitals reviewed.   Assessment/Plan: 1. Calculus of gallbladder without cholecystitis without obstruction 52 year old with persistent symptomatic cholelithiasis versus chronic cholecystitis.   No evidence of biliary obstruction. Discussed with the patient in detail about  the options and I do recommend cholecystectomy. The risks, benefits, complications, treatment options, and expected outcomes were discussed with the patient. The possibilities of bleeding, recurrent infection, finding a normal gallbladder, perforation of viscus organs, damage to surrounding structures, bile leak, abscess formation, needing a drain placed, the need for additional procedures, reaction to medication, pulmonary aspiration,  failure to diagnose a condition, the possible need to  convert to an open procedure, and creating a complication requiring transfusion or operation were discussed with the patient. The patient and/or family concurred with the proposed plan, giving informed consent.  Copy of this report will be sent to the referring provider  Sterling Bigiego Delrico Minehart, MD [facility] General Surgeon

## 2017-06-05 ENCOUNTER — Ambulatory Visit: Payer: BC Managed Care – PPO | Admitting: Certified Registered Nurse Anesthetist

## 2017-06-05 ENCOUNTER — Encounter: Payer: Self-pay | Admitting: *Deleted

## 2017-06-05 ENCOUNTER — Telehealth: Payer: Self-pay | Admitting: Surgery

## 2017-06-05 ENCOUNTER — Other Ambulatory Visit: Payer: Self-pay

## 2017-06-05 ENCOUNTER — Ambulatory Visit
Admission: RE | Admit: 2017-06-05 | Discharge: 2017-06-05 | Disposition: A | Discharge status: Home / Self Care | Payer: BC Managed Care – PPO | Source: Ambulatory Visit | Attending: Surgery | Admitting: Surgery

## 2017-06-05 ENCOUNTER — Encounter
Admission: RE | Disposition: A | Discharge status: Home / Self Care | Payer: Self-pay | Source: Ambulatory Visit | Attending: Surgery

## 2017-06-05 DIAGNOSIS — Z8249 Family history of ischemic heart disease and other diseases of the circulatory system: Secondary | ICD-10-CM | POA: Insufficient documentation

## 2017-06-05 DIAGNOSIS — F329 Major depressive disorder, single episode, unspecified: Secondary | ICD-10-CM | POA: Diagnosis not present

## 2017-06-05 DIAGNOSIS — I10 Essential (primary) hypertension: Secondary | ICD-10-CM | POA: Diagnosis not present

## 2017-06-05 DIAGNOSIS — K8020 Calculus of gallbladder without cholecystitis without obstruction: Secondary | ICD-10-CM | POA: Diagnosis present

## 2017-06-05 DIAGNOSIS — F419 Anxiety disorder, unspecified: Secondary | ICD-10-CM | POA: Diagnosis not present

## 2017-06-05 DIAGNOSIS — K8010 Calculus of gallbladder with chronic cholecystitis without obstruction: Secondary | ICD-10-CM | POA: Diagnosis not present

## 2017-06-05 DIAGNOSIS — K8050 Calculus of bile duct without cholangitis or cholecystitis without obstruction: Secondary | ICD-10-CM

## 2017-06-05 DIAGNOSIS — Z885 Allergy status to narcotic agent status: Secondary | ICD-10-CM | POA: Diagnosis not present

## 2017-06-05 HISTORY — PX: CHOLECYSTECTOMY: SHX55

## 2017-06-05 SURGERY — LAPAROSCOPIC CHOLECYSTECTOMY
Anesthesia: General

## 2017-06-05 MED ORDER — HYDROCODONE-ACETAMINOPHEN 5-325 MG PO TABS: 1.0000 | ORAL_TABLET | Freq: Four times a day (QID) | ORAL | 0 refills | Status: DC | PRN

## 2017-06-05 MED ORDER — SUGAMMADEX SODIUM 200 MG/2ML IV SOLN
INTRAVENOUS | Status: AC
  Filled 2017-06-05: qty 2

## 2017-06-05 MED ORDER — ACETAMINOPHEN 10 MG/ML IV SOLN
INTRAVENOUS | Status: DC | PRN
  Administered 2017-06-05: 1000 mg via INTRAVENOUS

## 2017-06-05 MED ORDER — KETOROLAC TROMETHAMINE 30 MG/ML IJ SOLN
INTRAMUSCULAR | Status: DC | PRN
  Administered 2017-06-05: 30 mg via INTRAVENOUS

## 2017-06-05 MED ORDER — PROMETHAZINE HCL 25 MG/ML IJ SOLN
6.2500 mg | INTRAMUSCULAR | Status: DC | PRN
  Administered 2017-06-05: 6.25 mg via INTRAVENOUS

## 2017-06-05 MED ORDER — HYDROMORPHONE HCL 1 MG/ML IJ SOLN
INTRAMUSCULAR | Status: AC
  Administered 2017-06-05: 0.25 mg via INTRAVENOUS
  Filled 2017-06-05: qty 1

## 2017-06-05 MED ORDER — LIDOCAINE HCL (PF) 2 % IJ SOLN
INTRAMUSCULAR | Status: AC
  Filled 2017-06-05: qty 10

## 2017-06-05 MED ORDER — FENTANYL CITRATE (PF) 100 MCG/2ML IJ SOLN
INTRAMUSCULAR | Status: DC | PRN
  Administered 2017-06-05: 100 ug via INTRAVENOUS

## 2017-06-05 MED ORDER — PROMETHAZINE HCL 25 MG/ML IJ SOLN
INTRAMUSCULAR | Status: AC
  Filled 2017-06-05: qty 1

## 2017-06-05 MED ORDER — BUPIVACAINE-EPINEPHRINE 0.25% -1:200000 IJ SOLN
INTRAMUSCULAR | Status: DC | PRN
  Administered 2017-06-05: 30 mL

## 2017-06-05 MED ORDER — HYDROMORPHONE HCL 1 MG/ML IJ SOLN
INTRAMUSCULAR | Status: AC
  Administered 2017-06-05: 0.5 mg via INTRAVENOUS
  Filled 2017-06-05: qty 1

## 2017-06-05 MED ORDER — ACETAMINOPHEN 10 MG/ML IV SOLN
INTRAVENOUS | Status: AC
  Filled 2017-06-05: qty 100

## 2017-06-05 MED ORDER — HYDROCODONE-ACETAMINOPHEN 7.5-325 MG PO TABS: 1.0000 | ORAL_TABLET | Freq: Once | ORAL | Status: DC | PRN

## 2017-06-05 MED ORDER — LACTATED RINGERS IV SOLN
INTRAVENOUS | Status: DC
  Administered 2017-06-05: 11:00:00 via INTRAVENOUS

## 2017-06-05 MED ORDER — KETOROLAC TROMETHAMINE 30 MG/ML IJ SOLN
INTRAMUSCULAR | Status: AC
  Filled 2017-06-05: qty 1

## 2017-06-05 MED ORDER — MIDAZOLAM HCL 2 MG/2ML IJ SOLN
INTRAMUSCULAR | Status: AC
  Filled 2017-06-05: qty 2

## 2017-06-05 MED ORDER — DEXAMETHASONE SODIUM PHOSPHATE 10 MG/ML IJ SOLN
INTRAMUSCULAR | Status: AC
  Filled 2017-06-05: qty 1

## 2017-06-05 MED ORDER — LIDOCAINE HCL (CARDIAC) 20 MG/ML IV SOLN
INTRAVENOUS | Status: DC | PRN
  Administered 2017-06-05: 80 mg via INTRAVENOUS

## 2017-06-05 MED ORDER — DEXAMETHASONE SODIUM PHOSPHATE 10 MG/ML IJ SOLN
INTRAMUSCULAR | Status: DC | PRN
  Administered 2017-06-05: 5 mg via INTRAVENOUS

## 2017-06-05 MED ORDER — CHLORHEXIDINE GLUCONATE CLOTH 2 % EX PADS
6.0000 | MEDICATED_PAD | Freq: Once | CUTANEOUS | Status: AC
  Administered 2017-06-05: 6 via TOPICAL

## 2017-06-05 MED ORDER — BUPIVACAINE-EPINEPHRINE (PF) 0.25% -1:200000 IJ SOLN
INTRAMUSCULAR | Status: AC
  Filled 2017-06-05: qty 30

## 2017-06-05 MED ORDER — MIDAZOLAM HCL 2 MG/2ML IJ SOLN
INTRAMUSCULAR | Status: DC | PRN
  Administered 2017-06-05 (×2): 2 mg via INTRAVENOUS

## 2017-06-05 MED ORDER — HYDROCODONE-ACETAMINOPHEN 5-325 MG PO TABS
1.0000 | ORAL_TABLET | Freq: Four times a day (QID) | ORAL | Status: DC | PRN
  Administered 2017-06-05: 1 via ORAL

## 2017-06-05 MED ORDER — ROCURONIUM BROMIDE 50 MG/5ML IV SOLN
INTRAVENOUS | Status: AC
  Filled 2017-06-05: qty 1

## 2017-06-05 MED ORDER — ONDANSETRON HCL 4 MG/2ML IJ SOLN
INTRAMUSCULAR | Status: AC
  Filled 2017-06-05: qty 2

## 2017-06-05 MED ORDER — PROPOFOL 10 MG/ML IV BOLUS
INTRAVENOUS | Status: DC | PRN
  Administered 2017-06-05: 100 mg via INTRAVENOUS

## 2017-06-05 MED ORDER — SUGAMMADEX SODIUM 200 MG/2ML IV SOLN
INTRAVENOUS | Status: DC | PRN
  Administered 2017-06-05: 200 mg via INTRAVENOUS

## 2017-06-05 MED ORDER — HYDROCODONE-ACETAMINOPHEN 5-325 MG PO TABS
ORAL_TABLET | ORAL | Status: AC
  Filled 2017-06-05: qty 1

## 2017-06-05 MED ORDER — CHLORHEXIDINE GLUCONATE CLOTH 2 % EX PADS: 6.0000 | MEDICATED_PAD | Freq: Once | CUTANEOUS | Status: DC

## 2017-06-05 MED ORDER — SODIUM CHLORIDE 0.9 % IJ SOLN
INTRAMUSCULAR | Status: AC
  Filled 2017-06-05: qty 10

## 2017-06-05 MED ORDER — ROCURONIUM BROMIDE 100 MG/10ML IV SOLN
INTRAVENOUS | Status: DC | PRN
  Administered 2017-06-05: 40 mg via INTRAVENOUS

## 2017-06-05 MED ORDER — CEFAZOLIN SODIUM-DEXTROSE 2-4 GM/100ML-% IV SOLN
INTRAVENOUS | Status: AC
  Filled 2017-06-05: qty 100

## 2017-06-05 MED ORDER — FENTANYL CITRATE (PF) 100 MCG/2ML IJ SOLN
INTRAMUSCULAR | Status: AC
  Filled 2017-06-05: qty 2

## 2017-06-05 MED ORDER — PROPOFOL 10 MG/ML IV BOLUS
INTRAVENOUS | Status: AC
  Filled 2017-06-05: qty 20

## 2017-06-05 MED ORDER — ONDANSETRON HCL 4 MG/2ML IJ SOLN
INTRAMUSCULAR | Status: DC | PRN
  Administered 2017-06-05: 4 mg via INTRAVENOUS

## 2017-06-05 MED ORDER — HYDROMORPHONE HCL 1 MG/ML IJ SOLN
0.2500 mg | INTRAMUSCULAR | Status: DC | PRN
  Administered 2017-06-05: 0.25 mg via INTRAVENOUS
  Administered 2017-06-05: 0.5 mg via INTRAVENOUS
  Administered 2017-06-05: 0.25 mg via INTRAVENOUS
  Administered 2017-06-05 (×2): 0.5 mg via INTRAVENOUS

## 2017-06-05 SURGICAL SUPPLY — 45 items
APPLICATOR COTTON TIP 6IN STRL (MISCELLANEOUS) ×2 IMPLANT
APPLIER CLIP 5 13 M/L LIGAMAX5 (MISCELLANEOUS) ×2
BLADE SURG 15 STRL LF DISP TIS (BLADE) ×1 IMPLANT
BLADE SURG 15 STRL SS (BLADE) ×1
CANISTER SUCT 1200ML W/VALVE (MISCELLANEOUS) ×2 IMPLANT
CHLORAPREP W/TINT 26ML (MISCELLANEOUS) ×2 IMPLANT
CHOLANGIOGRAM CATH TAUT (CATHETERS) IMPLANT
CLEANER CAUTERY TIP 5X5 PAD (MISCELLANEOUS) ×1 IMPLANT
CLIP APPLIE 5 13 M/L LIGAMAX5 (MISCELLANEOUS) ×1 IMPLANT
DECANTER SPIKE VIAL GLASS SM (MISCELLANEOUS) ×4 IMPLANT
DERMABOND ADVANCED (GAUZE/BANDAGES/DRESSINGS) ×1
DERMABOND ADVANCED .7 DNX12 (GAUZE/BANDAGES/DRESSINGS) ×1 IMPLANT
DRAPE C-ARM XRAY 36X54 (DRAPES) ×2 IMPLANT
ELECT CAUTERY BLADE 6.4 (BLADE) ×2 IMPLANT
ELECT REM PT RETURN 9FT ADLT (ELECTROSURGICAL) ×2
ELECTRODE REM PT RTRN 9FT ADLT (ELECTROSURGICAL) ×1 IMPLANT
GLOVE BIO SURGEON STRL SZ7 (GLOVE) ×2 IMPLANT
GOWN STRL REUS W/ TWL LRG LVL3 (GOWN DISPOSABLE) ×3 IMPLANT
GOWN STRL REUS W/TWL LRG LVL3 (GOWN DISPOSABLE) ×3
IRRIGATION STRYKERFLOW (MISCELLANEOUS) ×1 IMPLANT
IRRIGATOR STRYKERFLOW (MISCELLANEOUS) ×2
IV CATH ANGIO 12GX3 LT BLUE (NEEDLE) ×2 IMPLANT
IV NS 1000ML (IV SOLUTION) ×1
IV NS 1000ML BAXH (IV SOLUTION) ×1 IMPLANT
L-HOOK LAP DISP 36CM (ELECTROSURGICAL) ×4
LHOOK LAP DISP 36CM (ELECTROSURGICAL) ×2 IMPLANT
NEEDLE HYPO 22GX1.5 SAFETY (NEEDLE) ×2 IMPLANT
PACK LAP CHOLECYSTECTOMY (MISCELLANEOUS) ×2 IMPLANT
PAD CLEANER CAUTERY TIP 5X5 (MISCELLANEOUS) ×1
PENCIL ELECTRO HAND CTR (MISCELLANEOUS) ×2 IMPLANT
POUCH SPECIMEN RETRIEVAL 10MM (ENDOMECHANICALS) ×2 IMPLANT
SCISSORS METZENBAUM CVD 33 (INSTRUMENTS) ×2 IMPLANT
SLEEVE ENDOPATH XCEL 5M (ENDOMECHANICALS) ×4 IMPLANT
SOL ANTI-FOG 6CC FOG-OUT (MISCELLANEOUS) ×1 IMPLANT
SOL FOG-OUT ANTI-FOG 6CC (MISCELLANEOUS) ×1
SPONGE LAP 18X18 5 PK (GAUZE/BANDAGES/DRESSINGS) ×2 IMPLANT
STOPCOCK 4 WAY LG BORE MALE ST (IV SETS) IMPLANT
SUT ETHIBOND 0 MO6 C/R (SUTURE) IMPLANT
SUT MNCRL AB 4-0 PS2 18 (SUTURE) ×2 IMPLANT
SUT VICRYL 0 AB UR-6 (SUTURE) ×4 IMPLANT
SYR 20CC LL (SYRINGE) ×2 IMPLANT
TROCAR XCEL BLUNT TIP 100MML (ENDOMECHANICALS) ×2 IMPLANT
TROCAR XCEL NON-BLD 5MMX100MML (ENDOMECHANICALS) ×2 IMPLANT
TUBING INSUFFLATION (TUBING) ×2 IMPLANT
WATER STERILE IRR 1000ML POUR (IV SOLUTION) ×2 IMPLANT

## 2017-06-05 NOTE — Anesthesia Postprocedure Evaluation (Signed)
Anesthesia Post Note  Patient: Jeanne Ramos  Procedure(s) Performed: LAPAROSCOPIC CHOLECYSTECTOMY (N/A )  Patient location during evaluation: Endoscopy Anesthesia Type: General Level of consciousness: awake and alert Pain management: pain level controlled Vital Signs Assessment: post-procedure vital signs reviewed and stable Respiratory status: spontaneous breathing, nonlabored ventilation and respiratory function stable Cardiovascular status: blood pressure returned to baseline and stable Postop Assessment: no apparent nausea or vomiting Anesthetic complications: no     Last Vitals:  Vitals:   06/05/17 1357 06/05/17 1409  BP: 115/73 118/67  Pulse: 78 65  Resp: 14 16  Temp:  (!) 36.3 C  SpO2: 98% 100%    Last Pain:  Vitals:   06/05/17 1409  TempSrc: Temporal  PainSc: 7                  Kyrstin Campillo Garry Heater Quamaine Webb

## 2017-06-05 NOTE — Op Note (Signed)
Laparoscopic Cholecystectomy  Pre-operative Diagnosis: Symptomatic cholelithiasis  Post-operative Diagnosis: same  Procedure: Laparoscopic cholecystectomy  Surgeon: Sterling Bigiego Kamrin Spath, MD FACS  Anesthesia: Gen. with endotracheal tube  Findings: GS  Estimated Blood Loss: 5cc         Drains: none         Specimens: Gallbladder           Complications: none   Procedure Details  The patient was seen again in the Holding Room. The benefits, complications, treatment options, and expected outcomes were discussed with the patient. The risks of bleeding, infection, recurrence of symptoms, failure to resolve symptoms, bile duct damage, bile duct leak, retained common bile duct stone, bowel injury, any of which could require further surgery and/or ERCP, stent, or papillotomy were reviewed with the patient. The likelihood of improving the patient's symptoms with return to their baseline status is good.  The patient and/or family concurred with the proposed plan, giving informed consent.  The patient was taken to Operating Room, identified as Jeanne Ramos and the procedure verified as Laparoscopic Cholecystectomy.  A Time Out was held and the above information confirmed.  Prior to the induction of general anesthesia, antibiotic prophylaxis was administered. VTE prophylaxis was in place. General endotracheal anesthesia was then administered and tolerated well. After the induction, the abdomen was prepped with Chloraprep and draped in the sterile fashion. The patient was positioned in the supine position.  Cut down technique was used to enter the abdominal cavity and a Hasson trochar was placed after two vicryl stitches were anchored to the fascia. Pneumoperitoneum was then created with CO2 and tolerated well without any adverse changes in the patient's vital signs.  Three 5-mm ports were placed in the right upper quadrant all under direct vision. All skin incisions  were infiltrated with a local  anesthetic agent before making the incision and placing the trocars.   The patient was positioned  in reverse Trendelenburg, tilted slightly to the patient's left.  The gallbladder was identified, the fundus grasped and retracted cephalad. Adhesions were lysed bluntly. The infundibulum was grasped and retracted laterally, exposing the peritoneum overlying the triangle of Calot. This was then divided and exposed in a blunt fashion. An extended critical view of the cystic duct and cystic artery was obtained.  The cystic duct was clearly identified and bluntly dissected.   Artery and duct were double clipped and divided.  The gallbladder was taken from the gallbladder fossa in a retrograde fashion with the electrocautery. The gallbladder was removed and placed in an Endocatch bag. The liver bed was irrigated and inspected. Hemostasis was achieved with the electrocautery. Copious irrigation was utilized and was repeatedly aspirated until clear.  The gallbladder and Endocatch sac were then removed through a port site.   Inspection of the right upper quadrant was performed. No bleeding, bile duct injury or leak, or bowel injury was noted. Pneumoperitoneum was released.  The periumbilical port site was closed with interrumpted 0 Vicryl sutures. 4-0 subcuticular Monocryl was used to close the skin. Dermabond was  applied.  The patient was then extubated and brought to the recovery room in stable condition. Sponge, lap, and needle counts were correct at closure and at the conclusion of the case.               Sterling Bigiego Celestial Barnfield, MD, FACS

## 2017-06-05 NOTE — Anesthesia Preprocedure Evaluation (Addendum)
Anesthesia Evaluation  Patient identified by MRN, date of birth, ID band Patient awake    Reviewed: Allergy & Precautions, H&P , NPO status , reviewed documented beta blocker date and time   Airway Mallampati: II  TM Distance: <3 FB     Dental  (+) Teeth Intact   Pulmonary    Pulmonary exam normal        Cardiovascular hypertension, Normal cardiovascular exam     Neuro/Psych  Headaches, PSYCHIATRIC DISORDERS Anxiety Depression    GI/Hepatic negative GI ROS, Neg liver ROS,   Endo/Other  negative endocrine ROS  Renal/GU negative Renal ROS  negative genitourinary   Musculoskeletal   Abdominal   Peds  Hematology negative hematology ROS (+)   Anesthesia Other Findings Allergy   . Anxiety  . Dense breast tissue on mammogram 10/24/2016 . Depression  . Dyspareunia in female 10/24/2016 . Elevated blood pressure reading 07/19/2016 . Frequent headaches  . Irregular menses  . Migraine headache with aura 07/19/2016 . Migraines  . Seasonal allergic rhinitis 06/02/2017 . Situational mixed anxiety and depressive disorder 12/22/2014 . Vaginal atrophy  Reproductive/Obstetrics                            Anesthesia Physical Anesthesia Plan  ASA: II  Anesthesia Plan: General ETT   Post-op Pain Management:    Induction:   PONV Risk Score and Plan: 3 and Ondansetron, Midazolam and Dexamethasone  Airway Management Planned:   Additional Equipment:   Intra-op Plan:   Post-operative Plan:   Informed Consent: I have reviewed the patients History and Physical, chart, labs and discussed the procedure including the risks, benefits and alternatives for the proposed anesthesia with the patient or authorized representative who has indicated his/her understanding and acceptance.   Dental Advisory Given  Plan Discussed with: CRNA  Anesthesia Plan Comments:         Anesthesia Quick  Evaluation

## 2017-06-05 NOTE — Interval H&P Note (Signed)
History and Physical Interval Note:  06/05/2017 11:28 AM  Jeanne MaskPatricia B Ramos  has presented today for surgery, with the diagnosis of N/A  The various methods of treatment have been discussed with the patient and family. After consideration of risks, benefits and other options for treatment, the patient has consented to  Procedure(s): LAPAROSCOPIC CHOLECYSTECTOMY (N/A) as a surgical intervention .  The patient's history has been reviewed, patient examined, no change in status, stable for surgery.  I have reviewed the patient's chart and labs.  Questions were answered to the patient's satisfaction.     Cyera Balboni F Mallery Harshman

## 2017-06-05 NOTE — Transfer of Care (Signed)
Immediate Anesthesia Transfer of Care Note  Patient: Jeanne Ramos  Procedure(s) Performed: LAPAROSCOPIC CHOLECYSTECTOMY (N/A )  Patient Location: PACU  Anesthesia Type:General  Level of Consciousness: sedated  Airway & Oxygen Therapy: Patient Spontanous Breathing and Patient connected to face mask oxygen  Post-op Assessment: Report given to RN and Post -op Vital signs reviewed and stable  Post vital signs: Reviewed and stable  Last Vitals:  Vitals Value Taken Time  BP 128/72 06/05/2017 12:51 PM  Temp 36.6 C 06/05/2017 12:51 PM  Pulse 93 06/05/2017 12:52 PM  Resp 19 06/05/2017 12:52 PM  SpO2 100 % 06/05/2017 12:52 PM  Vitals shown include unvalidated device data.  Last Pain: There were no vitals filed for this visit.       Complications: No apparent anesthesia complications

## 2017-06-05 NOTE — Telephone Encounter (Signed)
Pt advised of pre op date/time and sx date. Sx: 06/05/17 with Dr Pabon--laparoscopic cholecystectomy.  Pre op: 06/05/17 @ 10:00am-same day of surgery.

## 2017-06-05 NOTE — Anesthesia Post-op Follow-up Note (Signed)
Anesthesia QCDR form completed.        

## 2017-06-05 NOTE — Discharge Instructions (Signed)

## 2017-06-05 NOTE — Anesthesia Procedure Notes (Addendum)
Procedure Name: Intubation Date/Time: 06/05/2017 12:02 PM Performed by: Doreen Salvage, CRNA Pre-anesthesia Checklist: Patient identified, Patient being monitored, Timeout performed, Emergency Drugs available and Suction available Patient Re-evaluated:Patient Re-evaluated prior to induction Oxygen Delivery Method: Circle system utilized Preoxygenation: Pre-oxygenation with 100% oxygen Induction Type: IV induction Ventilation: Mask ventilation without difficulty Laryngoscope Size: Mac and 3 Grade View: Grade II Tube type: Oral Tube size: 7.0 mm Number of attempts: 1 Airway Equipment and Method: Stylet Placement Confirmation: ETT inserted through vocal cords under direct vision,  positive ETCO2 and breath sounds checked- equal and bilateral Secured at: 20 cm Tube secured with: Tape Dental Injury: Teeth and Oropharynx as per pre-operative assessment

## 2017-06-06 LAB — SURGICAL PATHOLOGY

## 2017-06-19 ENCOUNTER — Encounter: Payer: Self-pay | Admitting: Surgery

## 2017-06-19 ENCOUNTER — Ambulatory Visit (INDEPENDENT_AMBULATORY_CARE_PROVIDER_SITE_OTHER): Payer: BC Managed Care – PPO | Admitting: Surgery

## 2017-06-19 VITALS — BP 140/79 | HR 71 | Temp 98.1°F | Ht 61.0 in | Wt 100.0 lb

## 2017-06-19 DIAGNOSIS — Z09 Encounter for follow-up examination after completed treatment for conditions other than malignant neoplasm: Secondary | ICD-10-CM

## 2017-06-19 NOTE — Patient Instructions (Signed)

## 2017-06-19 NOTE — Progress Notes (Signed)
06/19/2017  HPI: Patient is s/p laparoscopic cholecystectomy on 3/28 with Dr. Everlene FarrierPabon.  She presents for follow up.  Reports that she's been having some vague symptoms with mild nausea intermittent, and some bloatedness.  Denies any worsening pain, fevers, or chills.  Has a good appetite and is tolerating a diet.  No constipation or diarrhea.  Vital signs: BP 140/79   Pulse 71   Temp 98.1 F (36.7 C) (Oral)   Ht 5\' 1"  (1.549 m)   Wt 100 lb (45.4 kg)   BMI 18.89 kg/m    Physical Exam: Constitutional: No acute distress Abdomen:  Soft, nondistended, nontender to palpation.  Incisions healing well, clean, dry, intact without evidence of infection  Assessment/Plan: 52 yo female s/p laparoscopic cholecystectomy  --discussed with patient that these symptoms are likely related to her cholecystectomy and will improve as her body adjusts after surgery. --she is healing well without other gross complications --may return prn   Howie IllJose Luis Giovani Neumeister, MD [facility]

## 2017-10-30 ENCOUNTER — Ambulatory Visit (INDEPENDENT_AMBULATORY_CARE_PROVIDER_SITE_OTHER): Payer: BC Managed Care – PPO | Admitting: Obstetrics and Gynecology

## 2017-10-30 ENCOUNTER — Encounter: Payer: Self-pay | Admitting: Obstetrics and Gynecology

## 2017-10-30 VITALS — BP 126/84 | HR 98 | Ht 60.0 in | Wt 103.0 lb

## 2017-10-30 DIAGNOSIS — Z01419 Encounter for gynecological examination (general) (routine) without abnormal findings: Secondary | ICD-10-CM

## 2017-10-30 MED ORDER — PRASTERONE 6.5 MG VA INST: 1.0000 | VAGINAL_INSERT | Freq: Every day | VAGINAL | 11 refills | Status: DC

## 2017-10-30 NOTE — Progress Notes (Signed)
Subjective:   Jeanne Ramos is a 52 y.o. G30P3002 Caucasian female here for a routine well-woman exam.  No LMP recorded. Patient is postmenopausal.    Current complaints: 'stomach issues' since cholecystectomy PCP: [facility]       does desire labs  Social History: Sexual: heterosexual Marital Status: married Living situation: with spouse Occupation: teacher-retiring in 30 days Tobacco/alcohol: no tobacco use Illicit drugs: no history of illicit drug use  The following portions of the patient's history were reviewed and updated as appropriate: allergies, current medications, past family history, past medical history, past social history, past surgical history and problem list.  Past Medical History Past Medical History:  Diagnosis Date  . Allergy   . Anxiety   . Dense breast tissue on mammogram 10/24/2016  . Depression   . Dyspareunia in female 10/24/2016  . Elevated blood pressure reading 07/19/2016  . Frequent headaches   . Irregular menses   . Migraine headache with aura 07/19/2016  . Migraines   . Seasonal allergic rhinitis 06/02/2017  . Situational mixed anxiety and depressive disorder 12/22/2014  . Vaginal atrophy 10/24/2016    Past Surgical History Past Surgical History:  Procedure Laterality Date  . CESAREAN SECTION  [phone]  . CHOLECYSTECTOMY N/A 06/05/2017   Procedure: LAPAROSCOPIC CHOLECYSTECTOMY;  Surgeon: Leafy Ro, MD;  Location: [facility] ORS;  Service: General;  Laterality: N/A;  . TUBAL LIGATION      Gynecologic History [id number]  No LMP recorded. Patient is postmenopausal. Contraception: post menopausal status and tubal ligation Last Pap: 2016. Results were: normal Last mammogram: 2017. Results were: normal   Obstetric History OB History  Gravida Para Term Preterm AB Living  3 3 3     2   SAB TAB Ectopic Multiple Live Births               # Outcome Date GA Lbr Len/2nd Weight Sex Delivery Anes PTL Lv  3 Term           2 Term           1 Term              Current Medications Current Outpatient Medications on File Prior to Visit  Medication Sig Dispense Refill  . aspirin-acetaminophen-caffeine (EXCEDRIN MIGRAINE) 250-250-65 MG tablet Take 1 tablet by mouth daily as needed for headache.     . cholecalciferol (VITAMIN D) 1000 units tablet Take 1,000 Units by mouth daily.    . Prasterone (INTRAROSA) 6.5 MG INST Place 1 Applicatorful vaginally at bedtime.     No current facility-administered medications on file prior to visit.     Review of Systems Patient denies any headaches, blurred vision, shortness of breath, chest pain, abdominal pain, problems with bowel movements, urination, or intercourse.  Objective:  BP 126/84   Pulse 98   Ht 5' (1.524 m)   Wt 103 lb (46.7 kg)   BMI 20.12 kg/m  Physical Exam  General:  Well developed, well nourished, no acute distress. She is alert and oriented x3. Skin:  Warm and dry Neck:  Midline trachea, no thyromegaly or nodules Cardiovascular: Regular rate and rhythm, no murmur heard Lungs:  Effort normal, all lung fields clear to auscultation bilaterally Breasts:  No dominant palpable mass, retraction, or nipple discharge Abdomen:  Soft, non tender, no hepatosplenomegaly or masses Pelvic:  External genitalia is normal in appearance.  The vagina is normal in appearance. The cervix is bulbous, no CMT.  Thin prep pap is not done. Uterus  is felt to be normal size, shape, and contour.  No adnexal masses or tenderness noted. Extremities:  No swelling or varicosities noted Psych:  She has a normal mood and affect  Assessment:   Healthy well-woman exam Vaginal atrophy  Plan:   F/U 1 year for AE, or sooner if needed Mammogram ordered  Melody Suzan NailerN Shambley, CNM

## 2017-10-31 ENCOUNTER — Other Ambulatory Visit: Payer: BC Managed Care – PPO

## 2017-11-01 LAB — LIPID PANEL
Chol/HDL Ratio: 3.9 ratio (ref 0.0–4.4)
Cholesterol, Total: 194 mg/dL (ref 100–199)
HDL: 50 mg/dL (ref >39)
LDL Calculated: 123 mg/dL — ABNORMAL HIGH (ref 0–99)
TRIGLYCERIDES: 107 mg/dL (ref 0–149)
VLDL CHOLESTEROL CAL: 21 mg/dL (ref 5–40)

## 2017-11-01 LAB — COMPREHENSIVE METABOLIC PANEL
A/G RATIO: 2.3 — AB (ref 1.2–2.2)
ALBUMIN: 4.4 g/dL (ref 3.5–5.5)
ALK PHOS: 34 IU/L — AB (ref 39–117)
ALT: 16 IU/L (ref 0–32)
AST: 18 IU/L (ref 0–40)
BILIRUBIN TOTAL: 0.5 mg/dL (ref 0.0–1.2)
BUN / CREAT RATIO: 21 (ref 9–23)
BUN: 18 mg/dL (ref 6–24)
CHLORIDE: 104 mmol/L (ref 96–106)
CO2: 24 mmol/L (ref 20–29)
Calcium: 9.3 mg/dL (ref 8.7–10.2)
Creatinine, Ser: 0.86 mg/dL (ref 0.57–1.00)
GFR calc non Af Amer: 78 mL/min/{1.73_m2} (ref >59)
GFR, EST AFRICAN AMERICAN: 90 mL/min/{1.73_m2} (ref >59)
GLOBULIN, TOTAL: 1.9 g/dL (ref 1.5–4.5)
Glucose: 70 mg/dL (ref 65–99)
POTASSIUM: 4.1 mmol/L (ref 3.5–5.2)
SODIUM: 144 mmol/L (ref 134–144)
Total Protein: 6.3 g/dL (ref 6.0–8.5)

## 2017-11-01 LAB — VITAMIN D 25 HYDROXY (VIT D DEFICIENCY, FRACTURES): Vit D, 25-Hydroxy: 38.1 ng/mL (ref 30.0–100.0)

## 2017-11-15 ENCOUNTER — Other Ambulatory Visit: Payer: Self-pay | Admitting: Obstetrics and Gynecology

## 2017-11-25 ENCOUNTER — Ambulatory Visit
Admission: RE | Admit: 2017-11-25 | Discharge: 2017-11-25 | Disposition: A | Discharge status: Home / Self Care | Payer: BC Managed Care – PPO | Source: Ambulatory Visit | Attending: Obstetrics and Gynecology | Admitting: Obstetrics and Gynecology

## 2017-11-25 DIAGNOSIS — Z01419 Encounter for gynecological examination (general) (routine) without abnormal findings: Secondary | ICD-10-CM | POA: Diagnosis not present

## 2018-04-02 ENCOUNTER — Encounter: Payer: Self-pay | Admitting: Emergency Medicine

## 2018-04-02 ENCOUNTER — Emergency Department: Payer: BC Managed Care – PPO

## 2018-04-02 ENCOUNTER — Emergency Department
Admission: EM | Admit: 2018-04-02 | Discharge: 2018-04-02 | Disposition: A | Discharge status: Home / Self Care | Payer: BC Managed Care – PPO | Attending: Emergency Medicine | Admitting: Emergency Medicine

## 2018-04-02 ENCOUNTER — Other Ambulatory Visit: Payer: Self-pay

## 2018-04-02 DIAGNOSIS — Y9289 Other specified places as the place of occurrence of the external cause: Secondary | ICD-10-CM | POA: Insufficient documentation

## 2018-04-02 DIAGNOSIS — W109XXA Fall (on) (from) unspecified stairs and steps, initial encounter: Secondary | ICD-10-CM | POA: Diagnosis not present

## 2018-04-02 DIAGNOSIS — Y9389 Activity, other specified: Secondary | ICD-10-CM | POA: Insufficient documentation

## 2018-04-02 DIAGNOSIS — M79672 Pain in left foot: Secondary | ICD-10-CM | POA: Diagnosis present

## 2018-04-02 DIAGNOSIS — R110 Nausea: Secondary | ICD-10-CM

## 2018-04-02 DIAGNOSIS — Y998 Other external cause status: Secondary | ICD-10-CM | POA: Insufficient documentation

## 2018-04-02 DIAGNOSIS — S93402A Sprain of unspecified ligament of left ankle, initial encounter: Secondary | ICD-10-CM | POA: Insufficient documentation

## 2018-04-02 DIAGNOSIS — S92352A Displaced fracture of fifth metatarsal bone, left foot, initial encounter for closed fracture: Secondary | ICD-10-CM | POA: Insufficient documentation

## 2018-04-02 DIAGNOSIS — W108XXA Fall (on) (from) other stairs and steps, initial encounter: Secondary | ICD-10-CM

## 2018-04-02 MED ORDER — OXYCODONE-ACETAMINOPHEN 5-325 MG PO TABS
1.0000 | ORAL_TABLET | Freq: Once | ORAL | Status: AC
  Administered 2018-04-02: 1 via ORAL
  Filled 2018-04-02: qty 1

## 2018-04-02 MED ORDER — FENTANYL CITRATE (PF) 100 MCG/2ML IJ SOLN
75.0000 ug | Freq: Once | INTRAMUSCULAR | Status: AC
  Administered 2018-04-02: 75 ug via INTRAMUSCULAR
  Filled 2018-04-02: qty 2

## 2018-04-02 MED ORDER — OXYCODONE HCL 5 MG PO TABS: 5.0000 mg | ORAL_TABLET | Freq: Four times a day (QID) | ORAL | 0 refills | Status: AC | PRN

## 2018-04-02 MED ORDER — ONDANSETRON 4 MG PO TBDP
4.0000 mg | ORAL_TABLET | Freq: Once | ORAL | Status: AC
  Administered 2018-04-02: 4 mg via ORAL
  Filled 2018-04-02: qty 1

## 2018-04-02 MED ORDER — ONDANSETRON 4 MG PO TBDP: 4.0000 mg | ORAL_TABLET | Freq: Three times a day (TID) | ORAL | 0 refills | Status: DC | PRN

## 2018-04-02 NOTE — ED Notes (Signed)
XR at bedside at this time.

## 2018-04-02 NOTE — ED Triage Notes (Signed)
Pt to triage via w/c with no distress noted; st fell going up steps injuring left ankle/foot

## 2018-04-02 NOTE — ED Provider Notes (Addendum)
[facility] Emergency Department Provider Note  ____________________________________________  Time seen: Approximately 7:19 AM  I have reviewed the triage vital signs and the nursing notes.   HISTORY  Chief Complaint Fall    HPI Jeanne Ramos is a 52 y.o. female, otherwise healthy, presenting w/ L foot pain after a mechanical fall.  She was walking down some steps holding a cooler when she missed a step, and fell down with immediate pain to the lateral malleolus and midfoot.  She was unable to walk afterwards.  She developed severe nausea from her pain which has now resolved after Zofran.  She did not hit her head and denies any neck or back pain.  Past Medical History:  Diagnosis Date  . Allergy   . Anxiety   . Dense breast tissue on mammogram 10/24/2016  . Depression   . Dyspareunia in female 10/24/2016  . Elevated blood pressure reading 07/19/2016  . Frequent headaches   . Irregular menses   . Migraine headache with aura 07/19/2016  . Migraines   . Seasonal allergic rhinitis 06/02/2017  . Situational mixed anxiety and depressive disorder 12/22/2014  . Vaginal atrophy 10/24/2016    Patient Active Problem List   Diagnosis Date Noted  . Colic, biliary   . Seasonal allergic rhinitis 06/02/2017  . Dyspareunia in female 10/24/2016  . Vaginal atrophy 10/24/2016  . Dense breast tissue on mammogram 10/24/2016  . Elevated blood pressure reading 07/19/2016  . Migraine headache with aura 07/19/2016  . Situational mixed anxiety and depressive disorder 12/22/2014    Past Surgical History:  Procedure Laterality Date  . CESAREAN SECTION  [phone]  . CHOLECYSTECTOMY N/A 06/05/2017   Procedure: LAPAROSCOPIC CHOLECYSTECTOMY;  Surgeon: Leafy Ro, MD;  Location: [facility] ORS;  Service: General;  Laterality: N/A;  . TUBAL LIGATION      Current Outpatient Rx  . Order #: [id number] Class: Historical Med  . Order #: [id number] Class: Historical Med  . Order  #: [id number] Class: Normal  . Order #: [id number] Class: Print  . Order #: [id number] Class: Print  . Order #: [id number] Class: Normal    Allergies Meperidine  Family History  Problem Relation Age of Onset  . Breast cancer Sister 85  . Breast cancer Paternal Grandmother   . Heart disease Mother   . Heart disease Father     Social History Social History   Tobacco Use  . Smoking status: Never Smoker  . Smokeless tobacco: Never Used  Substance Use Topics  . Alcohol use: No  . Drug use: No    Review of Systems Constitutional: No fever/chills.  No lightheadedness or syncope.  Positive mechanical fall. Eyes: No visual changes.  No visual changes. ENT: No sore throat. No congestion or rhinorrhea. Cardiovascular: Denies chest pain. Denies palpitations. Respiratory: Denies shortness of breath.  No cough. Gastrointestinal: No abdominal pain.  No nausea, no vomiting.  No diarrhea.  No constipation. Genitourinary: Negative for dysuria. Musculoskeletal: Negative for back pain.  Positive left foot pain.  Negative neck pain. Skin: Negative for rash. Neurological: Negative for headaches. No focal numbness, tingling or weakness.     ____________________________________________   PHYSICAL EXAM:  VITAL SIGNS: ED Triage Vitals  Enc Vitals Group     BP 04/02/18 0647 (!) 122/91     Pulse Rate 04/02/18 0647 100     Resp 04/02/18 0647 20     Temp --      Temp src --      SpO2 04/02/18 0647 100 %  Weight 04/02/18 0638 108 lb (49 kg)     Height 04/02/18 0638 5' (1.524 m)     Head Circumference --      Peak Flow --      Pain Score 04/02/18 0638 8     Pain Loc --      Pain Edu? --      Excl. in GC? --     Constitutional: Alert and oriented. Answers questions appropriately.  Uncomfortable appearing.  GCS is 15. Eyes: Conjunctivae are normal.  EOMI. No scleral icterus.  No raccoon eyes. Head: Atraumatic. Nose: No congestion/rhinnorhea.  No swelling over the nose or septal  hematoma. Mouth/Throat: Mucous membranes are moist.  No malocclusion or dental injury. Neck: No stridor.  Supple.  No midline C-spine tenderness to palpation, step-offs or deformities.  Full range of motion without pain. Cardiovascular: Normal rate, regular rhythm. No murmurs, rubs or gallops.  Respiratory: Normal respiratory effort.  No accessory muscle use or retractions. Lungs CTAB.  No wheezes, rales or ronchi. Musculoskeletal: Pelvis is stable.  The patient has full range of motion of the bilateral hips, knees and right ankle without pain.  The left ankle has swelling and pain over the lateral malleolus.  She also has pain diffusely in the midfoot without significant ecchymosis or swelling.  She has normal DP and PT pulses.  Her sensation is normal to light touch. Neurologic:  A&Ox3.  Speech is clear.  Face and smile are symmetric.  EOMI.  Moves all extremities well with the exception of the left foot and ankle.. Skin:  Skin is warm, dry and intact. No rash noted. Psychiatric: Mood and affect are normal. Speech and behavior are normal.  Normal judgement.  ____________________________________________   LABS (all labs ordered are listed, but only abnormal results are displayed)  Labs Reviewed - No data to display ____________________________________________  EKG  Not indicated ____________________________________________  RADIOLOGY  Dg Ankle Complete Left  Result Date: 04/02/2018 CLINICAL DATA:  Fall.  Pain. EXAM: LEFT ANKLE COMPLETE - 3+ VIEW COMPARISON:  No recent. FINDINGS: Severe soft tissue swelling noted over the lateral malleolus. Very subtle nondisplaced fracture of the distal tip of the lateral malleolus can not be totally excluded. Angulated, displaced, comminuted fracture of the left fifth metatarsal noted. IMPRESSION: 1. Angulated, displaced, comminuted fracture of the left fifth metatarsal noted. 2. Severe soft tissue swelling of the lateral malleolus. Tiny nondisplaced  avulsion fracture from the distal tip of the lateral malleolus can not be excluded Electronically Signed   By: Maisie Fushomas  Register   On: 04/02/2018 07:34    ____________________________________________   PROCEDURES  Procedure(s) performed: None  .Splint Application Date/Time: 04/02/2018 7:38 AM Performed by: Rockne MenghiniNorman, Anne-Caroline, MD Authorized by: Rockne MenghiniNorman, Anne-Caroline, MD   Consent:    Consent obtained:  Verbal   Consent given by:  Patient   Risks discussed:  Discoloration, numbness, pain and swelling Pre-procedure details:    Sensation:  Normal Procedure details:    Laterality:  Left   Location:  Foot   Foot:  L foot   Strapping: no     Cast type:  Short leg   Supplies:  Ortho-Glass Post-procedure details:    Pain:  Unchanged   Sensation:  Normal   Patient tolerance of procedure:  Tolerated well, no immediate complications    Critical Care performed: No ____________________________________________   INITIAL IMPRESSION / ASSESSMENT AND PLAN / ED COURSE  Pertinent labs & imaging results that were available during my care of the patient were  reviewed by me and considered in my medical decision making (see chart for details).  53 y.o. female presenting with a mechanical fall down the steps with resulting left foot and ankle pain.  The patient has had an x-ray which does show a displaced comminuted fracture in the fifth metatarsal.  She is neuro vascularly intact.  I have treated her with fentanyl and Zofran for her symptoms, and we are elevating her foot and have applied ice to decrease swelling and pain.  I have contacted Dr. Hyacinth MeekerMiller, the orthopedist on-call, to review the patient's x-rays for final disposition.  ----------------------------------------- 7:35 AM on 04/02/2018 -----------------------------------------  I have spoken with Dr. Hyacinth MeekerMiller who is reviewed the patient's films.  Most likely, conservative management with nonweightbearing activity and a boot will  allow this fracture to heal.  She will be seen in the clinic this afternoon or tomorrow morning to remove her splint and place her in a boot.  She will be given crutches and instructions to be nonweightbearing.  I will discharge her home with a pain management plan and instructions for elevation and icing.  At this time, the patient is safe for discharge home.  The radiographic reading on the patient's ankle film does not exclude a nondisplaced avulsion fracture in the distal tip of the lateral malleolus.  Given her significant swelling, this is possible, will make sure to immobilize her ankle as well as her midfoot.  I have discussed the possibility of this additional fracture with the patient.  ____________________________________________  FINAL CLINICAL IMPRESSION(S) / ED DIAGNOSES  Final diagnoses:  Fall down stairs, initial encounter  Displaced fracture of fifth metatarsal bone, left foot, initial encounter for closed fracture  Sprain of left ankle, unspecified ligament, initial encounter  Nausea without vomiting         NEW MEDICATIONS STARTED DURING THIS VISIT:  New Prescriptions   ONDANSETRON (ZOFRAN ODT) 4 MG DISINTEGRATING TABLET    Take 1 tablet (4 mg total) by mouth every 8 (eight) hours as needed for nausea or vomiting.   OXYCODONE (ROXICODONE) 5 MG IMMEDIATE RELEASE TABLET    Take 1 tablet (5 mg total) by mouth every 6 (six) hours as needed for severe pain.      Rockne MenghiniNorman, Anne-Caroline, MD 04/02/18 [id number]    Rockne MenghiniNorman, Anne-Caroline, MD 04/02/18 [phone]

## 2018-04-02 NOTE — Discharge Instructions (Addendum)
Please keep your splint on at all times until you are given a boot after evaluation by the orthopedist.  Do not bear any weight on your foot until you are cleared to do so by the orthopedist.  You may take Tylenol for mild to moderate pain, and oxycodone for severe pain.  Please keep your left foot elevated above the level of your heart as much as possible to decrease swelling and pain.  In addition, you may apply ice over your splint for 15 minutes every 1-2 hours when you are awake for the next 2 days.  Return to the emergency department if you develop severe pain, numbness tingling or weakness, or any other symptoms concerning to you.

## 2018-04-06 DIAGNOSIS — S92353A Displaced fracture of fifth metatarsal bone, unspecified foot, initial encounter for closed fracture: Secondary | ICD-10-CM | POA: Insufficient documentation

## 2018-11-05 ENCOUNTER — Encounter: Payer: BC Managed Care – PPO | Admitting: Obstetrics and Gynecology

## 2018-11-18 ENCOUNTER — Encounter: Payer: Self-pay | Admitting: Obstetrics and Gynecology

## 2018-11-18 ENCOUNTER — Other Ambulatory Visit (HOSPITAL_COMMUNITY)
Admission: RE | Admit: 2018-11-18 | Discharge: 2018-11-18 | Disposition: A | Discharge status: Home / Self Care | Payer: BC Managed Care – PPO | Source: Ambulatory Visit | Attending: Obstetrics and Gynecology | Admitting: Obstetrics and Gynecology

## 2018-11-18 ENCOUNTER — Ambulatory Visit (INDEPENDENT_AMBULATORY_CARE_PROVIDER_SITE_OTHER): Payer: BC Managed Care – PPO | Admitting: Obstetrics and Gynecology

## 2018-11-18 ENCOUNTER — Other Ambulatory Visit: Payer: Self-pay

## 2018-11-18 VITALS — BP 121/82 | HR 76 | Ht 60.0 in | Wt 106.7 lb

## 2018-11-18 DIAGNOSIS — Z23 Encounter for immunization: Secondary | ICD-10-CM | POA: Diagnosis not present

## 2018-11-18 DIAGNOSIS — Z01419 Encounter for gynecological examination (general) (routine) without abnormal findings: Secondary | ICD-10-CM | POA: Diagnosis present

## 2018-11-18 MED ORDER — INTRAROSA 6.5 MG VA INST: 1.0000 | VAGINAL_INSERT | Freq: Every day | VAGINAL | 11 refills | Status: DC

## 2018-11-18 NOTE — Progress Notes (Signed)
Subjective:   Jeanne Ramos is a 53 y.o. G40P3002 Caucasian female here for a routine well-woman exam.  No LMP recorded. Patient is postmenopausal.    Current complaints: none PCP: [facility]       does desire labs  Social History: Sexual: heterosexual Marital Status: married Living situation: with spouse Occupation: retired Tobacco/alcohol: no tobacco use Illicit drugs: no history of illicit drug use  The following portions of the patient's history were reviewed and updated as appropriate: allergies, current medications, past family history, past medical history, past social history, past surgical history and problem list.  Past Medical History Past Medical History:  Diagnosis Date  . Allergy   . Anxiety   . Dense breast tissue on mammogram 10/24/2016  . Depression   . Dyspareunia in female 10/24/2016  . Elevated blood pressure reading 07/19/2016  . Frequent headaches   . Irregular menses   . Migraine headache with aura 07/19/2016  . Migraines   . Seasonal allergic rhinitis 06/02/2017  . Situational mixed anxiety and depressive disorder 12/22/2014  . Vaginal atrophy 10/24/2016    Past Surgical History Past Surgical History:  Procedure Laterality Date  . CESAREAN SECTION  [id number]  . CHOLECYSTECTOMY N/A 06/05/2017   Procedure: LAPAROSCOPIC CHOLECYSTECTOMY;  Surgeon: Jules Husbands, MD;  Location: [facility] ORS;  Service: General;  Laterality: N/A;  . TUBAL LIGATION      Gynecologic History [id number]  No LMP recorded. Patient is postmenopausal. Contraception: post menopausal status Last Pap: 2015. Results were: normal Last mammogram: 11/2017. Results were: normal   Obstetric History OB History  Gravida Para Term Preterm AB Living  3 3 3     2   SAB TAB Ectopic Multiple Live Births               # Outcome Date GA Lbr Len/2nd Weight Sex Delivery Anes PTL Lv  3 Term           2 Term           1 Term             Current Medications Current Outpatient Medications on  File Prior to Visit  Medication Sig Dispense Refill  . aspirin-acetaminophen-caffeine (EXCEDRIN MIGRAINE) 250-250-65 MG tablet Take 1 tablet by mouth daily as needed for headache.     . cholecalciferol (VITAMIN D) 1000 units tablet Take 1,000 Units by mouth daily.    . Prasterone (INTRAROSA) 6.5 MG INST Place 1 Applicatorful vaginally at bedtime. 30 each 11  . ondansetron (ZOFRAN ODT) 4 MG disintegrating tablet Take 1 tablet (4 mg total) by mouth every 8 (eight) hours as needed for nausea or vomiting. (Patient not taking: Reported on 11/18/2018) 6 tablet 0  . oxyCODONE (ROXICODONE) 5 MG immediate release tablet Take 1 tablet (5 mg total) by mouth every 6 (six) hours as needed for severe pain. (Patient not taking: Reported on 11/18/2018) 12 tablet 0   No current facility-administered medications on file prior to visit.     Review of Systems Patient denies any headaches, blurred vision, shortness of breath, chest pain, abdominal pain, problems with bowel movements, urination, or intercourse.  Objective:  BP 121/82   Pulse 76   Ht 5' (1.524 m)   Wt 106 lb 11.2 oz (48.4 kg)   BMI 20.84 kg/m  Physical Exam  General:  Well developed, well nourished, no acute distress. She is alert and oriented x3. Skin:  Warm and dry Neck:  Midline trachea, no thyromegaly or nodules Cardiovascular: Regular  rate and rhythm, no murmur heard Lungs:  Effort normal, all lung fields clear to auscultation bilaterally Breasts:  No dominant palpable mass, retraction, or nipple discharge Abdomen:  Soft, non tender, no hepatosplenomegaly or masses Pelvic:  External genitalia is normal in appearance.  The vagina is normal in appearance. The cervix is bulbous, no CMT.  Thin prep pap is done with HR HPV cotesting. Uterus is felt to be normal size, shape, and contour.  No adnexal masses or tenderness noted. Extremities:  No swelling or varicosities noted Psych:  She has a normal mood and affect  Assessment:   Healthy  well-woman exam Needs flu vaccine Plan:  Flu vaccine given F/U 1 year for AE, or sooner if needed Mammogram ordered   Suzan NailerN , CNM

## 2018-11-19 LAB — COMPREHENSIVE METABOLIC PANEL
ALT: 14 IU/L (ref 0–32)
AST: 17 IU/L (ref 0–40)
Albumin/Globulin Ratio: 2.4 — ABNORMAL HIGH (ref 1.2–2.2)
Albumin: 4.6 g/dL (ref 3.8–4.9)
Alkaline Phosphatase: 36 IU/L — ABNORMAL LOW (ref 39–117)
BUN/Creatinine Ratio: 21 (ref 9–23)
BUN: 16 mg/dL (ref 6–24)
Bilirubin Total: 0.3 mg/dL (ref 0.0–1.2)
CO2: 25 mmol/L (ref 20–29)
Calcium: 9.7 mg/dL (ref 8.7–10.2)
Chloride: 103 mmol/L (ref 96–106)
Creatinine, Ser: 0.76 mg/dL (ref 0.57–1.00)
GFR calc Af Amer: 103 mL/min/{1.73_m2} (ref >59)
GFR calc non Af Amer: 89 mL/min/{1.73_m2} (ref >59)
Globulin, Total: 1.9 g/dL (ref 1.5–4.5)
Glucose: 105 mg/dL — ABNORMAL HIGH (ref 65–99)
Potassium: 3.8 mmol/L (ref 3.5–5.2)
Sodium: 142 mmol/L (ref 134–144)
Total Protein: 6.5 g/dL (ref 6.0–8.5)

## 2018-11-19 LAB — LIPID PANEL
Chol/HDL Ratio: 3.9 ratio (ref 0.0–4.4)
Cholesterol, Total: 202 mg/dL — ABNORMAL HIGH (ref 100–199)
HDL: 52 mg/dL (ref >39)
LDL Chol Calc (NIH): 130 mg/dL — ABNORMAL HIGH (ref 0–99)
Triglycerides: 110 mg/dL (ref 0–149)
VLDL Cholesterol Cal: 20 mg/dL (ref 5–40)

## 2018-11-19 LAB — HEMOGLOBIN A1C
Est. average glucose Bld gHb Est-mCnc: 100 mg/dL
Hgb A1c MFr Bld: 5.1 % (ref 4.8–5.6)

## 2018-11-19 LAB — TSH: TSH: 2.25 u[IU]/mL (ref 0.450–4.500)

## 2018-11-21 LAB — CYTOLOGY - PAP
Diagnosis: NEGATIVE
HPV: NOT DETECTED

## 2019-01-11 ENCOUNTER — Ambulatory Visit
Admission: RE | Admit: 2019-01-11 | Discharge: 2019-01-11 | Disposition: A | Discharge status: Home / Self Care | Payer: BC Managed Care – PPO | Source: Ambulatory Visit | Attending: Obstetrics and Gynecology | Admitting: Obstetrics and Gynecology

## 2019-01-11 DIAGNOSIS — Z1231 Encounter for screening mammogram for malignant neoplasm of breast: Secondary | ICD-10-CM | POA: Insufficient documentation

## 2019-01-11 DIAGNOSIS — Z01419 Encounter for gynecological examination (general) (routine) without abnormal findings: Secondary | ICD-10-CM

## 2019-02-18 ENCOUNTER — Telehealth: Payer: Self-pay

## 2019-02-18 NOTE — Telephone Encounter (Signed)
Patient notified us that she rec a letter from her ins company. Prescription of "Intrarosa" 6.5 mg will expire at the end of Dec. Patient insurance will only cover generic brand of estradiol,imvexxy. Please have a provider send a new prescription before Jan 1st using the generic method. Please update patient once complete

## 2019-02-19 NOTE — Telephone Encounter (Signed)
Dr. Marcelline Mates, Patient is requesting Imvexxy prescription be sent in due to changes in insurance coverage.  Switching from Intrarosa 6.5MG  nightly, will she start on 4 MCG or 10MCG of Imvexxy?  Also, will she use 1 vaginal insert daily for the first two weeks then twice weekly after that or since she is switching from Intrarosa to Imvexxy will she start twice weekly from the beginning?  Please advice.  Thanks. Jennye Moccasin

## 2019-02-19 NOTE — Telephone Encounter (Signed)
She should begin with the 4 mg dosing.  Because they are 2 different medications, she should use 1 insert daily x 2 weeks then decrease to twice weekly.  If after a month or so she is still having symptoms, then we can increase to the 10 mg dosing.   Dr. Marcelline Mates

## 2019-02-23 ENCOUNTER — Other Ambulatory Visit: Payer: Self-pay

## 2019-02-23 MED ORDER — IMVEXXY STARTER PACK 4 MCG VA INST: 1.0000 | VAGINAL_INSERT | Freq: Every day | VAGINAL | 0 refills | Status: DC

## 2019-02-23 MED ORDER — IMVEXXY MAINTENANCE PACK 4 MCG VA INST: 1.0000 | VAGINAL_INSERT | VAGINAL | 10 refills | Status: DC

## 2019-02-23 NOTE — Telephone Encounter (Signed)
Called and spoke with patient, Rx sent.  Patient aware of Dr. Andreas Blower recommendation and verbalized understanding.

## 2019-03-24 ENCOUNTER — Telehealth: Payer: Self-pay

## 2019-03-24 NOTE — Telephone Encounter (Signed)
Pt called requesting a refill on estradol medication. I advised Pt that she had 10 refills on the prescription that she takes only twice a week. The vaginal medication was only for 14 days an no refill. Patient understood.

## 2019-03-24 NOTE — Telephone Encounter (Signed)
Called patient and advised her to contact pharmacy and request refill.  Patient made aware she has 10 refills and patient verbalized understanding.

## 2019-05-06 ENCOUNTER — Ambulatory Visit (INDEPENDENT_AMBULATORY_CARE_PROVIDER_SITE_OTHER): Payer: BC Managed Care – PPO | Admitting: Family Medicine

## 2019-05-06 ENCOUNTER — Other Ambulatory Visit: Payer: Self-pay

## 2019-05-06 ENCOUNTER — Encounter: Payer: Self-pay | Admitting: Family Medicine

## 2019-05-06 VITALS — BP 135/61 | HR 72 | Temp 97.5°F | Ht 60.0 in | Wt 108.0 lb

## 2019-05-06 DIAGNOSIS — Z7189 Other specified counseling: Secondary | ICD-10-CM | POA: Insufficient documentation

## 2019-05-06 DIAGNOSIS — Z0184 Encounter for antibody response examination: Secondary | ICD-10-CM

## 2019-05-06 DIAGNOSIS — Z021 Encounter for pre-employment examination: Secondary | ICD-10-CM | POA: Diagnosis not present

## 2019-05-06 NOTE — Progress Notes (Signed)
Subjective:    Patient ID: Jeanne Ramos, female    DOB: 1965-06-20, 54 y.o.   MRN: [id number]  Jeanne Ramos is a 54 y.o. female presenting on 05/06/2019 for Establish Care (work physical)   HPI   Jeanne Ramos presents to clinic as a new patient for work physical.  HEALTH MAINTENANCE:  Weight/BMI: Normal BMI Mammogram: Completes with OBGYN PAP: Completes with OBGYN Colon cancer screening: Discussed colonoscopy and cologuard.  Offered and declined Optometry: 1x a year Dentistry: Every 6 months  IMMUNIZATIONS: Influenza: 11/18/2018 Tetanus: 12/21/2015  No flowsheet data found.  Past Medical History:  Diagnosis Date  . Allergy   . Anxiety   . Dense breast tissue on mammogram 10/24/2016  . Depression   . Dyspareunia in female 10/24/2016  . Elevated blood pressure reading 07/19/2016  . Frequent headaches   . Irregular menses   . Migraine headache with aura 07/19/2016  . Migraines   . Seasonal allergic rhinitis 06/02/2017  . Situational mixed anxiety and depressive disorder 12/22/2014  . Vaginal atrophy 10/24/2016   Past Surgical History:  Procedure Laterality Date  . CESAREAN SECTION  [phone]  . CHOLECYSTECTOMY N/A 06/05/2017   Procedure: LAPAROSCOPIC CHOLECYSTECTOMY;  Surgeon: Jules Husbands, MD;  Location: [facility] ORS;  Service: General;  Laterality: N/A;  . TUBAL LIGATION     Social History   Socioeconomic History  . Marital status: Married    Spouse name: Not on file  . Number of children: 3  . Years of education: 27  . Highest education level: Not on file  Occupational History  . Occupation: Educator  Tobacco Use  . Smoking status: Never Smoker  . Smokeless tobacco: Never Used  Substance and Sexual Activity  . Alcohol use: No  . Drug use: No  . Sexual activity: Yes  Other Topics Concern  . Not on file  Social History Narrative   Fun/Hobby: read and sew   Denies abuse and feels safe at home   Social Determinants of Health   Financial  Resource Strain:   . Difficulty of Paying Living Expenses: Not on file  Food Insecurity:   . Worried About Charity fundraiser in the Last Year: Not on file  . Ran Out of Food in the Last Year: Not on file  Transportation Needs:   . Lack of Transportation (Medical): Not on file  . Lack of Transportation (Non-Medical): Not on file  Physical Activity:   . Days of Exercise per Week: Not on file  . Minutes of Exercise per Session: Not on file  Stress:   . Feeling of Stress : Not on file  Social Connections:   . Frequency of Communication with Friends and Family: Not on file  . Frequency of Social Gatherings with Friends and Family: Not on file  . Attends Religious Services: Not on file  . Active Member of Clubs or Organizations: Not on file  . Attends Archivist Meetings: Not on file  . Marital Status: Not on file  Intimate Partner Violence:   . Fear of Current or Ex-Partner: Not on file  . Emotionally Abused: Not on file  . Physically Abused: Not on file  . Sexually Abused: Not on file   Family History  Problem Relation Age of Onset  . Breast cancer Sister 90  . Breast cancer Paternal Grandmother   . Heart disease Mother   . Heart disease Father    Current Outpatient Medications on File Prior to Visit  Medication Sig  . aspirin-acetaminophen-caffeine (EXCEDRIN MIGRAINE) 250-250-65 MG tablet Take 1 tablet by mouth daily as needed for headache.   . Estradiol (IMVEXXY MAINTENANCE PACK) 4 MCG INST Place 1 each vaginally 2 (two) times a week.  . Multiple Vitamin (MULTIVITAMIN) tablet Take 1 tablet by mouth daily.  . vitamin E 1000 UNIT capsule Take 1,000 Units by mouth daily.   No current facility-administered medications on file prior to visit.    Per HPI unless specifically indicated above     Objective:    BP 135/61 (BP Location: Left Arm, Patient Position: Sitting, Cuff Size: Normal)   Pulse 72   Temp (!) 97.5 F (36.4 C) (Oral)   Ht 5' (1.524 m)   Wt 108 lb  (49 kg)   SpO2 100%   BMI 21.09 kg/m   Wt Readings from Last 3 Encounters:  05/06/19 108 lb (49 kg)  11/18/18 106 lb 11.2 oz (48.4 kg)  04/02/18 108 lb (49 kg)    Physical Exam Vitals reviewed.  Constitutional:      General: She is not in acute distress.    Appearance: Normal appearance. She is well-groomed and normal weight. She is not ill-appearing.  HENT:     Head: Normocephalic.  Eyes:     General:        Right eye: No discharge.        Left eye: No discharge.     Extraocular Movements: Extraocular movements intact.     Conjunctiva/sclera: Conjunctivae normal.     Pupils: Pupils are equal, round, and reactive to light.  Pulmonary:     Effort: Pulmonary effort is normal.  Skin:    General: Skin is warm and dry.  Neurological:     General: No focal deficit present.     Mental Status: She is alert and oriented to person, place, and time.     Cranial Nerves: Cranial nerves are intact.     Motor: Motor function is intact.     Coordination: Coordination is intact.     Gait: Gait is intact. Gait normal.  Psychiatric:        Attention and Perception: Attention and perception normal.        Mood and Affect: Mood and affect normal.        Speech: Speech normal.        Behavior: Behavior normal. Behavior is cooperative.        Thought Content: Thought content normal.        Cognition and Memory: Cognition and memory normal.        Judgment: Judgment normal.     Results for orders placed or performed in visit on 11/18/18  Lipid panel  Result Value Ref Range   Cholesterol, Total 202 (H) 100 - 199 mg/dL   Triglycerides 110 0 - 149 mg/dL   HDL 52 >39 mg/dL   VLDL Cholesterol Cal 20 5 - 40 mg/dL   LDL Chol Calc (NIH) 130 (H) 0 - 99 mg/dL   Chol/HDL Ratio 3.9 0.0 - 4.4 ratio  Comprehensive metabolic panel  Result Value Ref Range   Glucose 105 (H) 65 - 99 mg/dL   BUN 16 6 - 24 mg/dL   Creatinine, Ser 0.76 0.57 - 1.00 mg/dL   GFR calc non Af Amer 89 >59 mL/min/1.73   GFR  calc Af Amer 103 >59 mL/min/1.73   BUN/Creatinine Ratio 21 9 - 23   Sodium 142 134 - 144 mmol/L   Potassium 3.8 3.5 -  5.2 mmol/L   Chloride 103 96 - 106 mmol/L   CO2 25 20 - 29 mmol/L   Calcium 9.7 8.7 - 10.2 mg/dL   Total Protein 6.5 6.0 - 8.5 g/dL   Albumin 4.6 3.8 - 4.9 g/dL   Globulin, Total 1.9 1.5 - 4.5 g/dL   Albumin/Globulin Ratio 2.4 (H) 1.2 - 2.2   Bilirubin Total 0.3 0.0 - 1.2 mg/dL   Alkaline Phosphatase 36 (L) 39 - 117 IU/L   AST 17 0 - 40 IU/L   ALT 14 0 - 32 IU/L  Hemoglobin A1c  Result Value Ref Range   Hgb A1c MFr Bld 5.1 4.8 - 5.6 %   Est. average glucose Bld gHb Est-mCnc 100 mg/dL  TSH  Result Value Ref Range   TSH 2.250 0.450 - 4.500 uIU/mL  Cytology - PAP  Result Value Ref Range   Adequacy      Satisfactory for evaluation  endocervical/transformation zone component PRESENT.   Diagnosis      NEGATIVE FOR INTRAEPITHELIAL LESIONS OR MALIGNANCY.   HPV NOT Detected    Material Submitted CervicoVaginal Pap [ThinPrep Imaged]       Assessment & Plan:   Problem List Items Addressed This Visit    None    Visit Diagnoses    Immunity status testing    -  Primary   Relevant Orders   Measles/Mumps/Rubella Immunity   Hepatitis B Core Antibody, IgM      No orders of the defined types were placed in this encounter.     Follow up plan: Return in about 1 year (around 05/05/2020) for Physical.   Will return to clinic tomorrow for lab draw for immunity to Hep B & MMR, as well as PPD placement. Will return to clinic on Monday for PPD read.  Offered and declined sooner appointment for PE and labs, follows with OBGYN for her yearly labs.  Harlin Rain, FNP-C Family Nurse Practitioner [facility] 05/06/2019, 3:15 PM

## 2019-05-06 NOTE — Progress Notes (Signed)
I have reviewed this encounter including the documentation in this note and/or discussed this patient with the provider, Danielle Rankin FNP. I am certifying that I agree with the content of this note as supervising physician.  Saralyn Pilar, DO [facility] 05/06/2019, 4:03 PM

## 2019-05-06 NOTE — Assessment & Plan Note (Signed)
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  Labs for titers ordered today to be drawn tomorrow and PPD to be placed tomorrow and read on Monday.  Plan: Patient follows with OB/GYN for all labs and screenings.  Offered and declined follow up for labs and physical here in primary care.

## 2019-05-06 NOTE — Patient Instructions (Addendum)
We will see you back tomorrow for lab draw and PPD placement  Will call when we receive your titer results  You will receive a survey after today's visit either digitally by e-mail or paper by USPS mail. Your experiences and feedback matter to [place].  Please respond so we know how we are doing as we provide care for you.  Call us with any questions/concerns/needs.  It is my goal to be available to you for your health concerns.  Thanks for choosing me to be a partner in your healthcare needs!  Charlaine Dalton, FNP-C Family Nurse Practitioner [facility] Phone: [phone]

## 2019-05-07 ENCOUNTER — Other Ambulatory Visit: Payer: BC Managed Care – PPO

## 2019-05-07 ENCOUNTER — Other Ambulatory Visit: Payer: Self-pay | Admitting: Family Medicine

## 2019-05-07 DIAGNOSIS — Z021 Encounter for pre-employment examination: Secondary | ICD-10-CM | POA: Diagnosis not present

## 2019-05-07 NOTE — Addendum Note (Signed)
Addended by: Lonna Cobb on: 05/07/2019 11:15 AM   Modules accepted: Orders

## 2019-05-10 LAB — TB SKIN TEST
Induration: 0 mm
TB Skin Test: NEGATIVE

## 2019-05-10 LAB — MEASLES/MUMPS/RUBELLA IMMUNITY
Mumps IgG: <9 AU/mL — ABNORMAL LOW
Rubella: 3.07 Index
Rubeola IgG: <13.5 AU/mL — ABNORMAL LOW

## 2019-05-10 LAB — HEPATITIS B CORE ANTIBODY, IGM: Hep B C IgM: NONREACTIVE

## 2019-05-10 NOTE — Progress Notes (Signed)
Measles & Mumps NonImmune.  Immune for Rubella.  Would have to have an MMR vaccine and a second MMR vaccine 28 days later to be considered immune.  Her Hepatitis B shows no immunity.  I will finish her paperwork and she can pick up from the front desk.  Thanks

## 2019-05-11 ENCOUNTER — Ambulatory Visit (INDEPENDENT_AMBULATORY_CARE_PROVIDER_SITE_OTHER): Payer: BC Managed Care – PPO

## 2019-05-11 ENCOUNTER — Other Ambulatory Visit: Payer: Self-pay

## 2019-05-11 DIAGNOSIS — Z23 Encounter for immunization: Secondary | ICD-10-CM

## 2019-06-09 ENCOUNTER — Other Ambulatory Visit: Payer: Self-pay

## 2019-06-09 ENCOUNTER — Ambulatory Visit (INDEPENDENT_AMBULATORY_CARE_PROVIDER_SITE_OTHER): Payer: BC Managed Care – PPO

## 2019-06-09 DIAGNOSIS — Z23 Encounter for immunization: Secondary | ICD-10-CM | POA: Diagnosis not present

## 2019-07-12 ENCOUNTER — Encounter: Payer: Self-pay | Admitting: Family Medicine

## 2019-07-12 ENCOUNTER — Other Ambulatory Visit: Payer: Self-pay

## 2019-07-12 ENCOUNTER — Telehealth (INDEPENDENT_AMBULATORY_CARE_PROVIDER_SITE_OTHER): Payer: BC Managed Care – PPO | Admitting: Family Medicine

## 2019-07-12 DIAGNOSIS — J019 Acute sinusitis, unspecified: Secondary | ICD-10-CM | POA: Diagnosis not present

## 2019-07-12 MED ORDER — AMOXICILLIN-POT CLAVULANATE 875-125 MG PO TABS: 1.0000 | ORAL_TABLET | Freq: Two times a day (BID) | ORAL | 0 refills | Status: DC

## 2019-07-12 MED ORDER — FLUCONAZOLE 150 MG PO TABS: ORAL_TABLET | ORAL | 0 refills | Status: DC

## 2019-07-12 NOTE — Patient Instructions (Signed)
As we discussed, based on your reported symptoms, appears you have a sinus infection.  I have sent in a prescription for Augmentin 875/125mg  to take 1 tablet twice per day for the next 10 days.    For your vaginal yeast infections that happen after antibiotics, I have sent in a prescription for Diflucan 150mg , to take 1 tablet at onset of symptoms and to repeat the dosing 72 hours later if symptoms still present.  Can try over the counter mucinex, flonase and zyrtec, claritin or allegra to help with clearing up of secretions.  Follow up with if you have any worsening of symptoms or no relief with current treatment prescribed.  You will receive a survey after today's visit either digitally by e-mail or paper by [place]. Your experiences and feedback matter to [employer].  Please respond so we know how we are doing as we provide care for you.  Call us with any questions/concerns/needs.  It is my goal to be available to you for your health concerns.  Thanks for choosing me to be a partner in your healthcare needs!  [place], FNP-C Family Nurse Practitioner [facility] Phone: [phone]

## 2019-07-12 NOTE — Progress Notes (Signed)
Virtual Visit via Telephone  The purpose of this virtual visit is to provide medical care while limiting exposure to the novel coronavirus (COVID19) for both patient and office staff.  Consent was obtained for phone visit:  Yes.   Answered questions that patient had about telehealth interaction:  Yes.   I discussed the limitations, risks, security and privacy concerns of performing an evaluation and management service by telephone. I also discussed with the patient that there may be a patient responsible charge related to this service. The patient expressed understanding and agreed to proceed.  Patient is at home and is accessed via telephone Services are provided by Charlaine Dalton, FNP-C from [facility])  ---------------------------------------------------------------------- Chief Complaint  Patient presents with  . Headache    sinus headache,sinus pressure around the left eye only , swollen gland on left side, left eye crusty, w/ greenish mucus congestion x 1.5 weeks     S: Reviewed CMA documentation. I have called patient and gathered additional HPI as follows:  Jeanne Ramos presents for telemedicine visit for concerns of sinus headache, sinus congestion with sinus pressure around left eye only with swollen cervical lymph nodes, left eye with crusting and greenish mucus congestion x 1.5 weeks.  Reports has a history of sinus infections in the past, does get vaginal yeast infections after antibiotics.  Has not found anything that has helped her symptoms in the past 1.5 weeks.  Patient is currently working  Denies any high risk travel to areas of current concern for COVID19. Denies any known or suspected exposure to person with or possibly with COVID19.  Denies any fevers, chills, sweats, body ache, cough, shortness of breath,  abdominal pain, diarrhea  Past Medical History:  Diagnosis Date  . Allergy   . Anxiety   . Dense breast tissue on mammogram  10/24/2016  . Depression   . Dyspareunia in female 10/24/2016  . Elevated blood pressure reading 07/19/2016  . Frequent headaches   . Irregular menses   . Migraine headache with aura 07/19/2016  . Migraines   . Seasonal allergic rhinitis 06/02/2017  . Situational mixed anxiety and depressive disorder 12/22/2014  . Vaginal atrophy 10/24/2016   Social History   Tobacco Use  . Smoking status: Never Smoker  . Smokeless tobacco: Never Used  Substance Use Topics  . Alcohol use: No  . Drug use: No    Current Outpatient Medications:  .  aspirin-acetaminophen-caffeine (EXCEDRIN MIGRAINE) 250-250-65 MG tablet, Take 1 tablet by mouth daily as needed for headache. , Disp: , Rfl:  .  Estradiol (IMVEXXY MAINTENANCE PACK) 4 MCG INST, Place 1 each vaginally 2 (two) times a week., Disp: 8 each, Rfl: 10 .  loratadine (CLARITIN) 10 MG tablet, Take 10 mg by mouth daily., Disp: , Rfl:  .  Multiple Vitamin (MULTIVITAMIN) tablet, Take 1 tablet by mouth daily., Disp: , Rfl:  .  vitamin E 1000 UNIT capsule, Take 1,000 Units by mouth daily., Disp: , Rfl:  .  amoxicillin-clavulanate (AUGMENTIN) 875-125 MG tablet, Take 1 tablet by mouth 2 (two) times daily., Disp: 20 tablet, Rfl: 0 .  fluconazole (DIFLUCAN) 150 MG tablet, Take 1 tablet now and repeat dose in 72 hours if having continued symptoms., Disp: 2 tablet, Rfl: 0  No flowsheet data found.  No flowsheet data found.  -------------------------------------------------------------------------- O: No physical exam performed due to remote telephone encounter.  Physical Exam: Patient remotely monitored without video.  Verbal communication appropriate.  Cognition normal.  Recent Results (from the past 2160 hour(s))  Measles/Mumps/Rubella Immunity     Status: Abnormal   Collection Time: 05/07/19 10:49 AM  Result Value Ref Range   Rubeola IgG <13.50 (L) AU/mL    Comment: AU/mL            Interpretation -----            -------------- <13.50            Not consistent with immunity 13.50-16.49      Equivocal >16.49           Consistent with immunity . The presence of measles IgG suggests immunization or past or current infection with measles virus. . For additional information, please refer to http://education.[facility].com/faq/FAQ162 (This link is being provided for informational/ educational purposes only.) .    Mumps IgG <9.00 (L) AU/mL    Comment:  AU/mL           Interpretation -------         ---------------- <9.00             Not consistent with immunity 9.00-10.99        Equivocal >10.99            Consistent with immunity . The presence of mumps IgG antibody suggests immunization or past or current infection with mumps virus. .    Rubella 3.07 Index    Comment:     Index            Interpretation     -----            --------------       <0.90            Not consistent with immunity     0.90-0.99        Equivocal     > or = 1.00      Consistent with immunity  . The presence of rubella IgG antibody suggests  immunization or past or current infection with rubella virus.   Hepatitis B Core Antibody, IgM     Status: None   Collection Time: 05/07/19 10:49 AM  Result Value Ref Range   Hep B C IgM NON-REACTIVE NON-REACTI  PPD     Status: Normal   Collection Time: 05/10/19  9:07 AM  Result Value Ref Range   TB Skin Test Negative    Induration 0 mm    -------------------------------------------------------------------------- A&P:  Problem List Items Addressed This Visit      Respiratory   Acute sinus infection - Primary    Acute sinus infection x 1.5 weeks.  History of recurrent sinus infections.  History of vaginal yeast infections with antibiotic use.  Plan: 1. To begin Augmentin 875/125mg , 1 tablet twice per day for the next 10 days 2. Can pick up over the counter mucinex, use according to packaging directions to help thin secretions. 3. Can begin over the counter flonase and one of the following  allergy medications to help with secretions, zyrtec, claritin or allegra 4. Sent in Rx for Diflucan 150mg  for usage if begins to have vaginal yeast infection after antibiotic usage 5. Follow up as needed      Relevant Medications   loratadine (CLARITIN) 10 MG tablet   amoxicillin-clavulanate (AUGMENTIN) 875-125 MG tablet   fluconazole (DIFLUCAN) 150 MG tablet      Meds ordered this encounter  Medications  . amoxicillin-clavulanate (AUGMENTIN) 875-125 MG tablet    Sig: Take 1 tablet by mouth 2 (two) times daily.  Dispense:  20 tablet    Refill:  0  . fluconazole (DIFLUCAN) 150 MG tablet    Sig: Take 1 tablet now and repeat dose in 72 hours if having continued symptoms.    Dispense:  2 tablet    Refill:  0    Follow-up: - Return if any worsening of symptoms or if no relief with prescribed treatment  Patient verbalizes understanding with the above medical recommendations including the limitation of remote medical advice.  Specific follow-up and call-back criteria were given for patient to follow-up or seek medical care more urgently if needed.   - Time spent in direct consultation with patient on phone: 5 minutes  Charlaine Dalton, FNP-C [facility] 07/12/2019, 1:36 PM

## 2019-07-12 NOTE — Assessment & Plan Note (Signed)
Acute sinus infection x 1.5 weeks.  History of recurrent sinus infections.  History of vaginal yeast infections with antibiotic use.  Plan: 1. To begin Augmentin 875/125mg , 1 tablet twice per day for the next 10 days 2. Can pick up over the counter mucinex, use according to packaging directions to help thin secretions. 3. Can begin over the counter flonase and one of the following allergy medications to help with secretions, zyrtec, claritin or allegra 4. Sent in Rx for Diflucan 150mg  for usage if begins to have vaginal yeast infection after antibiotic usage 5. Follow up as needed

## 2019-11-19 ENCOUNTER — Other Ambulatory Visit: Payer: Self-pay

## 2019-11-19 ENCOUNTER — Encounter: Payer: Self-pay | Admitting: Certified Nurse Midwife

## 2019-11-19 ENCOUNTER — Ambulatory Visit (INDEPENDENT_AMBULATORY_CARE_PROVIDER_SITE_OTHER): Payer: BC Managed Care – PPO | Admitting: Certified Nurse Midwife

## 2019-11-19 VITALS — BP 119/79 | HR 68 | Ht 60.0 in | Wt 105.8 lb

## 2019-11-19 DIAGNOSIS — Z1231 Encounter for screening mammogram for malignant neoplasm of breast: Secondary | ICD-10-CM | POA: Diagnosis not present

## 2019-11-19 DIAGNOSIS — E7800 Pure hypercholesterolemia, unspecified: Secondary | ICD-10-CM | POA: Diagnosis not present

## 2019-11-19 DIAGNOSIS — Z01419 Encounter for gynecological examination (general) (routine) without abnormal findings: Secondary | ICD-10-CM

## 2019-11-19 NOTE — Progress Notes (Signed)
ANNUAL PREVENTATIVE CARE GYN  ENCOUNTER NOTE  Subjective:       Jeanne Ramos is a 54 y.o. [phone] female here for a routine annual gynecologic exam. No questions or concerns.   Denies difficulty breathing or respiratory distress, chest pain, abdominal pain, vaginal bleeding, dysuria, and leg pain or swelling.    Gynecologic History  No LMP recorded. Patient is postmenopausal.  Contraception: post menopausal status and tubal ligation  Last Pap: 11/2018. Results were: Neg/Neg  Last mammogram: 01/2019. Results were: BI-RADS 1  Last colonoscopy: due.   Obstetric History  OB History  Gravida Para Term Preterm AB Living  3 3 3     2   SAB TAB Ectopic Multiple Live Births               # Outcome Date GA Lbr Len/2nd Weight Sex Delivery Anes PTL Lv  3 Term           2 Term           1 Term             Past Medical History:  Diagnosis Date  . Allergy   . Anxiety   . Dense breast tissue on mammogram 10/24/2016  . Depression   . Dyspareunia in female 10/24/2016  . Elevated blood pressure reading 07/19/2016  . Frequent headaches   . Irregular menses   . Migraine headache with aura 07/19/2016  . Migraines   . Seasonal allergic rhinitis 06/02/2017  . Situational mixed anxiety and depressive disorder 12/22/2014  . Vaginal atrophy 10/24/2016    Past Surgical History:  Procedure Laterality Date  . CESAREAN SECTION  [phone]  . CHOLECYSTECTOMY N/A 06/05/2017   Procedure: LAPAROSCOPIC CHOLECYSTECTOMY;  Surgeon: 06/07/2017, MD;  Location: [facility] ORS;  Service: General;  Laterality: N/A;  . TUBAL LIGATION      Current Outpatient Medications on File Prior to Visit  Medication Sig Dispense Refill  . amoxicillin-clavulanate (AUGMENTIN) 875-125 MG tablet Take 1 tablet by mouth 2 (two) times daily. 20 tablet 0  . aspirin-acetaminophen-caffeine (EXCEDRIN MIGRAINE) 250-250-65 MG tablet Take 1 tablet by mouth daily as needed for headache.     . Estradiol (IMVEXXY MAINTENANCE  PACK) 4 MCG INST Place 1 each vaginally 2 (two) times a week. 8 each 10  . fluconazole (DIFLUCAN) 150 MG tablet Take 1 tablet now and repeat dose in 72 hours if having continued symptoms. 2 tablet 0  . loratadine (CLARITIN) 10 MG tablet Take 10 mg by mouth daily.    . Multiple Vitamin (MULTIVITAMIN) tablet Take 1 tablet by mouth daily.    . vitamin E 1000 UNIT capsule Take 1,000 Units by mouth daily.     No current facility-administered medications on file prior to visit.    Allergies  Allergen Reactions  . Meperidine Hcl     Other reaction(s): Edema  . Meperidine Swelling    Social History   Socioeconomic History  . Marital status: Married    Spouse name: Not on file  . Number of children: 3  . Years of education: 49  . Highest education level: Not on file  Occupational History  . Occupation: Educator  Tobacco Use  . Smoking status: Never Smoker  . Smokeless tobacco: Never Used  Vaping Use  . Vaping Use: Never used  Substance and Sexual Activity  . Alcohol use: No  . Drug use: No  . Sexual activity: Yes  Other Topics Concern  . Not on file  Social  History Narrative   Fun/Hobby: read and sew   Denies abuse and feels safe at home   Social Determinants of Health   Financial Resource Strain:   . Difficulty of Paying Living Expenses: Not on file  Food Insecurity:   . Worried About Programme researcher, broadcasting/film/video in the Last Year: Not on file  . Ran Out of Food in the Last Year: Not on file  Transportation Needs:   . Lack of Transportation (Medical): Not on file  . Lack of Transportation (Non-Medical): Not on file  Physical Activity:   . Days of Exercise per Week: Not on file  . Minutes of Exercise per Session: Not on file  Stress:   . Feeling of Stress : Not on file  Social Connections:   . Frequency of Communication with Friends and Family: Not on file  . Frequency of Social Gatherings with Friends and Family: Not on file  . Attends Religious Services: Not on file  .  Active Member of Clubs or Organizations: Not on file  . Attends Banker Meetings: Not on file  . Marital Status: Not on file  Intimate Partner Violence:   . Fear of Current or Ex-Partner: Not on file  . Emotionally Abused: Not on file  . Physically Abused: Not on file  . Sexually Abused: Not on file    Family History  Problem Relation Age of Onset  . Breast cancer Sister 36  . Breast cancer Paternal Grandmother   . Heart disease Mother   . Heart disease Father     The following portions of the patient's history were reviewed and updated as appropriate: allergies, current medications, past family history, past medical history, past social history, past surgical history and problem list.  Review of Systems  ROS negative except as noted above. Information obtained from patient.    Objective:   BP 119/79   Pulse 68   Ht 5' (1.524 m)   Wt 105 lb 12.8 oz (48 kg)   BMI 20.66 kg/m    CONSTITUTIONAL: Well-developed, well-nourished female in no acute distress.   PSYCHIATRIC: Normal mood and affect. Normal behavior. Normal judgment and thought content.  NEUROLGIC: Alert and oriented to person, place, and time. Normal muscle tone coordination. No cranial nerve deficit noted.  HENT:  Normocephalic, atraumatic, External right and left ear normal.  EYES: Conjunctivae and EOM are normal. Pupils are equal and round.   NECK: Normal range of motion, supple, no masses.  Normal thyroid.   SKIN: Skin is warm and dry. No rash noted. Not diaphoretic. No erythema. No pallor.  CARDIOVASCULAR: Normal heart rate noted, regular rhythm, no murmur.  RESPIRATORY: Clear to auscultation bilaterally. Effort and breath sounds normal, no problems with respiration noted.  BREASTS: Symmetric in size. No masses, skin changes, nipple drainage, or lymphadenopathy.  ABDOMEN: Soft, normal bowel sounds, no distention noted.  No tenderness, rebound or guarding.   PELVIC:  External Genitalia:  Normal  Vagina: Normal  Cervix: Normal  Uterus: Normal  Adnexa: Normal  MUSCULOSKELETAL: Normal range of motion. No tenderness.  No cyanosis, clubbing, or edema.  2+ distal pulses.  LYMPHATIC: No Axillary, Supraclavicular, or Inguinal Adenopathy.  Assessment:   Annual gynecologic examination 54 y.o.   Contraception: post menopausal status and tubal ligation   Normal BMI   Problem List Items Addressed This Visit    None    Visit Diagnoses    Well woman exam    -  Primary   Relevant Orders  MM 3D SCREEN BREAST BILATERAL   Breast cancer screening by mammogram       Relevant Orders   MM 3D SCREEN BREAST BILATERAL      Plan:   Pap: Not needed  Mammogram: Ordered  Stool Guaiac Testing:  Declined  Labs: CBC, CMP, Lipid Panel; see orders  Routine preventative health maintenance measures emphasized: Exercise/Diet/Weight control, Tobacco Warnings, Alcohol/Substance use risks and Stress Management; see AVS  Reviewed red flag symptoms and when to call  Return to Clinic - 1 Year for [facility] or sooner if needed   Serafina Royals, CNM  [facility], [facility] 11/19/19 8:27 AM

## 2019-11-19 NOTE — Patient Instructions (Signed)

## 2019-11-20 LAB — LIPID PANEL
Chol/HDL Ratio: 4.2 ratio (ref 0.0–4.4)
Cholesterol, Total: 232 mg/dL — ABNORMAL HIGH (ref 100–199)
HDL: 55 mg/dL (ref >39)
LDL Chol Calc (NIH): 163 mg/dL — ABNORMAL HIGH (ref 0–99)
Triglycerides: 79 mg/dL (ref 0–149)
VLDL Cholesterol Cal: 14 mg/dL (ref 5–40)

## 2019-11-20 LAB — COMPREHENSIVE METABOLIC PANEL
ALT: 23 IU/L (ref 0–32)
AST: 19 IU/L (ref 0–40)
Albumin/Globulin Ratio: 2.7 — ABNORMAL HIGH (ref 1.2–2.2)
Albumin: 4.9 g/dL (ref 3.8–4.9)
Alkaline Phosphatase: 40 IU/L — ABNORMAL LOW (ref 48–121)
BUN/Creatinine Ratio: 20 (ref 9–23)
BUN: 16 mg/dL (ref 6–24)
Bilirubin Total: 0.5 mg/dL (ref 0.0–1.2)
CO2: 26 mmol/L (ref 20–29)
Calcium: 9.4 mg/dL (ref 8.7–10.2)
Chloride: 103 mmol/L (ref 96–106)
Creatinine, Ser: 0.81 mg/dL (ref 0.57–1.00)
GFR calc Af Amer: 95 mL/min/{1.73_m2} (ref >59)
GFR calc non Af Amer: 82 mL/min/{1.73_m2} (ref >59)
Globulin, Total: 1.8 g/dL (ref 1.5–4.5)
Glucose: 81 mg/dL (ref 65–99)
Potassium: 3.8 mmol/L (ref 3.5–5.2)
Sodium: 141 mmol/L (ref 134–144)
Total Protein: 6.7 g/dL (ref 6.0–8.5)

## 2019-11-20 LAB — CBC
Hematocrit: 40.2 % (ref 34.0–46.6)
Hemoglobin: 13.3 g/dL (ref 11.1–15.9)
MCH: 29.8 pg (ref 26.6–33.0)
MCHC: 33.1 g/dL (ref 31.5–35.7)
MCV: 90 fL (ref 79–97)
Platelets: 239 10*3/uL (ref 150–450)
RBC: 4.47 x10E6/uL (ref 3.77–5.28)
RDW: 12.8 % (ref 11.7–15.4)
WBC: 6 10*3/uL (ref 3.4–10.8)

## 2020-01-12 ENCOUNTER — Other Ambulatory Visit: Payer: Self-pay

## 2020-01-12 ENCOUNTER — Ambulatory Visit
Admission: RE | Admit: 2020-01-12 | Discharge: 2020-01-12 | Disposition: A | Discharge status: Home / Self Care | Payer: BC Managed Care – PPO | Source: Ambulatory Visit | Attending: Certified Nurse Midwife | Admitting: Certified Nurse Midwife

## 2020-01-12 DIAGNOSIS — Z01419 Encounter for gynecological examination (general) (routine) without abnormal findings: Secondary | ICD-10-CM | POA: Diagnosis present

## 2020-01-12 DIAGNOSIS — Z1231 Encounter for screening mammogram for malignant neoplasm of breast: Secondary | ICD-10-CM | POA: Diagnosis present

## 2020-11-20 ENCOUNTER — Encounter: Payer: BC Managed Care – PPO | Admitting: Certified Nurse Midwife

## 2020-12-05 NOTE — Patient Instructions (Addendum)
Breast Self-Awareness Breast self-awareness is knowing how your breasts look and feel. Doing breast self-awareness is important. It allows you to catch a breast problem early while it is still small and can be treated. All women should do breast self-awareness, including women who have had breast implants. Tell your doctor if you notice a change in your breasts. What you need: A mirror. A well-lit room. How to do a breast self-exam A breast self-exam is one way to learn what is normal for your breasts and to check for changes. To do a breast self-exam: Look for changes  Take off all the clothes above your waist. Stand in front of a mirror in a room with good lighting. Put your hands on your hips. Push your hands down. Look at your breasts and nipples in the mirror to see if one breast or nipple looks different from the other. Check to see if: The shape of one breast is different. The size of one breast is different. There are wrinkles, dips, and bumps in one breast and not the other. Look at each breast for changes in the skin, such as: Redness. Scaly areas. Look for changes in your nipples, such as: Liquid around the nipples. Bleeding. Dimpling. Redness. A change in where the nipples are. Feel for changes  Lie on your back on the floor. Feel each breast. To do this, follow these steps: Pick a breast to feel. Put the arm closest to that breast above your head. Use your other arm to feel the nipple area of your breast. Feel the area with the pads of your three middle fingers by making small circles with your fingers. For the first circle, press lightly. For the second circle, press harder. For the third circle, press even harder. Keep making circles with your fingers at the different pressures as you move down your breast. Stop when you feel your ribs. Move your fingers a little toward the center of your body. Start making circles with your fingers again, this time going up until  you reach your collarbone. Keep making up-and-down circles until you reach your armpit. Remember to keep using the three pressures. Feel the other breast in the same way. Sit or stand in the tub or shower. With soapy water on your skin, feel each breast the same way you did in step 2 when you were lying on the floor. Write down what you find Writing down what you find can help you remember what to tell your doctor. Write down: What is normal for each breast. Any changes you find in each breast, including: The kind of changes you find. Whether you have pain. Size and location of any lumps. When you last had your menstrual period. General tips Check your breasts every month. If you are breastfeeding, the best time to check your breasts is after you feed your baby or after you use a breast pump. If you get menstrual periods, the best time to check your breasts is 5-7 days after your menstrual period is over. With time, you will become comfortable with the self-exam, and you will begin to know if there are changes in your breasts. Contact a doctor if you: See a change in the shape or size of your breasts or nipples. See a change in the skin of your breast or nipples, such as red or scaly skin. Have fluid coming from your nipples that is not normal. Find a lump or thick area that was not there before. Have pain in   your breasts. Have any concerns about your breast health. Summary Breast self-awareness includes looking for changes in your breasts, as well as feeling for changes within your breasts. Breast self-awareness should be done in front of a mirror in a well-lit room. You should check your breasts every month. If you get menstrual periods, the best time to check your breasts is 5-7 days after your menstrual period is over. Let your doctor know of any changes you see in your breasts, including changes in size, changes on the skin, pain or tenderness, or fluid from your nipples that is not  normal. This information is not intended to replace advice given to you by your health care provider. Make sure you discuss any questions you have with your health care provider. Document Revised: 10/14/2017 Document Reviewed: 10/14/2017 Elsevier Patient Education  2022 Elsevier Inc.    Preventive Care 40-64 Years Old, Female Preventive care refers to lifestyle choices and visits with your health care provider that can promote health and wellness. This includes: A yearly physical exam. This is also called an annual wellness visit. Regular dental and eye exams. Immunizations. Screening for certain conditions. Healthy lifestyle choices, such as: Eating a healthy diet. Getting regular exercise. Not using drugs or products that contain nicotine and tobacco. Limiting alcohol use. What can I expect for my preventive care visit? Physical exam Your health care provider will check your: Height and weight. These may be used to calculate your BMI (body mass index). BMI is a measurement that tells if you are at a healthy weight. Heart rate and blood pressure. Body temperature. Skin for abnormal spots. Counseling Your health care provider may ask you questions about your: Past medical problems. Family's medical history. Alcohol, tobacco, and drug use. Emotional well-being. Home life and relationship well-being. Sexual activity. Diet, exercise, and sleep habits. Work and work environment. Access to firearms. Method of birth control. Menstrual cycle. Pregnancy history. What immunizations do I need? Vaccines are usually given at various ages, according to a schedule. Your health care provider will recommend vaccines for you based on your age, medical history, and lifestyle or other factors, such as travel or where you work. What tests do I need? Blood tests Lipid and cholesterol levels. These may be checked every 5 years, or more often if you are over 50 years old. Hepatitis C  test. Hepatitis B test. Screening Lung cancer screening. You may have this screening every year starting at age 55 if you have a 30-pack-year history of smoking and currently smoke or have quit within the past 15 years. Colorectal cancer screening. All adults should have this screening starting at age 50 and continuing until age 75. Your health care provider may recommend screening at age 45 if you are at increased risk. You will have tests every 1-10 years, depending on your results and the type of screening test. Diabetes screening. This is done by checking your blood sugar (glucose) after you have not eaten for a while (fasting). You may have this done every 1-3 years. Mammogram. This may be done every 1-2 years. Talk with your health care provider about when you should start having regular mammograms. This may depend on whether you have a family history of breast cancer. BRCA-related cancer screening. This may be done if you have a family history of breast, ovarian, tubal, or peritoneal cancers. Pelvic exam and Pap test. This may be done every 3 years starting at age 21. Starting at age 30, this may be done every   5 years if you have a Pap test in combination with an HPV test. Other tests STD (sexually transmitted disease) testing, if you are at risk. Bone density scan. This is done to screen for osteoporosis. You may have this scan if you are at high risk for osteoporosis. Talk with your health care provider about your test results, treatment options, and if necessary, the need for more tests. Follow these instructions at home: Eating and drinking  Eat a diet that includes fresh fruits and vegetables, whole grains, lean protein, and low-fat dairy products. Take vitamin and mineral supplements as recommended by your health care provider. Do not drink alcohol if: Your health care provider tells you not to drink. You are pregnant, may be pregnant, or are planning to become pregnant. If  you drink alcohol: Limit how much you have to 0-1 drink a day. Be aware of how much alcohol is in your drink. In the [place] one drink equals one 12 oz bottle of beer (355 mL), one 5 oz glass of wine (148 mL), or one 1 oz glass of hard liquor (44 mL). Lifestyle Take daily care of your teeth and gums. Brush your teeth every morning and night with fluoride toothpaste. Floss one time each day. Stay active. Exercise for at least 30 minutes 5 or more days each week. Do not use any products that contain nicotine or tobacco, such as cigarettes, e-cigarettes, and chewing tobacco. If you need help quitting, ask your health care provider. Do not use drugs. If you are sexually active, practice safe sex. Use a condom or other form of protection to prevent STIs (sexually transmitted infections). If you do not wish to become pregnant, use a form of birth control. If you plan to become pregnant, see your health care provider for a prepregnancy visit. If told by your health care provider, take low-dose aspirin daily starting at age 47. Find healthy ways to cope with stress, such as: Meditation, yoga, or listening to music. Journaling. Talking to a trusted person. Spending time with friends and family. Safety Always wear your seat belt while driving or riding in a vehicle. Do not drive: If you have been drinking alcohol. Do not ride with someone who has been drinking. When you are tired or distracted. While texting. Wear a helmet and other protective equipment during sports activities. If you have firearms in your house, make sure you follow all gun safety procedures. What's next? Visit your health care provider once a year for an annual wellness visit. Ask your health care provider how often you should have your eyes and teeth checked. Stay up to date on all vaccines. This information is not intended to replace advice given to you by your health care provider. Make sure you discuss any questions you have  with your health care provider. Document Revised: 05/05/2020 Document Reviewed: 11/06/2017 Elsevier Patient Education  [employer].   Influenza (Flu) Vaccine (Inactivated or Recombinant): What You Need to Know 1. Why get vaccinated? Influenza vaccine can prevent influenza (flu). Flu is a contagious disease that spreads around the [place] every year, usually between October and May. Anyone can get the flu, but it is more dangerous for some people. Infants and young children, people 53 years and older, pregnant people, and people with certain health conditions or a weakened immune system are at greatest risk of flu complications. Pneumonia, bronchitis, sinus infections, and ear infections are examples of flu-related complications. If you have a medical condition, such as heart  disease, cancer, or diabetes, flu can make it worse. Flu can cause fever and chills, sore throat, muscle aches, fatigue, cough, headache, and runny or stuffy nose. Some people may have vomiting and diarrhea, though this is more common in children than adults. In an average year, thousands of people in the [place] die from flu, and many more are hospitalized. Flu vaccine prevents millions of illnesses and flu-related visits to the doctor each year. 2. Influenza vaccines CDC recommends everyone 6 months and older get vaccinated every flu season. Children 6 months through 42 years of age may need 2 doses during a single flu season. Everyone else needs only 1 dose each flu season. It takes about 2 weeks for protection to develop after vaccination. There are many flu viruses, and they are always changing. Each year a new flu vaccine is made to protect against the influenza viruses believed to be likely to cause disease in the upcoming flu season. Even when the vaccine doesn't exactly match these viruses, it may still provide some protection. Influenza vaccine does not cause flu. Influenza vaccine may be given at the  same time as other vaccines. 3. Talk with your health care provider Tell your vaccination provider if the person getting the vaccine: Has had an allergic reaction after a previous dose of influenza vaccine, or has any severe, life-threatening allergies Has ever had Guillain-Barr Syndrome (also called "GBS") In some cases, your health care provider may decide to postpone influenza vaccination until a future visit. Influenza vaccine can be administered at any time during pregnancy. People who are or will be pregnant during influenza season should receive inactivated influenza vaccine. People with minor illnesses, such as a cold, may be vaccinated. People who are moderately or severely ill should usually wait until they recover before getting influenza vaccine. Your health care provider can give you more information. 4. Risks of a vaccine reaction Soreness, redness, and swelling where the shot is given, fever, muscle aches, and headache can happen after influenza vaccination. There may be a very small increased risk of Guillain-Barr Syndrome (GBS) after inactivated influenza vaccine (the flu shot). Young children who get the flu shot along with pneumococcal vaccine (PCV13) and/or DTaP vaccine at the same time might be slightly more likely to have a seizure caused by fever. Tell your health care provider if a child who is getting flu vaccine has ever had a seizure. People sometimes faint after medical procedures, including vaccination. Tell your provider if you feel dizzy or have vision changes or ringing in the ears. As with any medicine, there is a very remote chance of a vaccine causing a severe allergic reaction, other serious injury, or death. 5. What if there is a serious problem? An allergic reaction could occur after the vaccinated person leaves the clinic. If you see signs of a severe allergic reaction (hives, swelling of the face and throat, difficulty breathing, a fast heartbeat, dizziness,  or weakness), call 9-1-1 and get the person to the nearest hospital. For other signs that concern you, call your health care provider. Adverse reactions should be reported to the Vaccine Adverse Event Reporting System (VAERS). Your health care provider will usually file this report, or you can do it yourself. Visit the VAERS website at www.vaers.SamedayNews.es or call 639-610-1971. VAERS is only for reporting reactions, and VAERS staff members do not give medical advice. 6. The National Vaccine Injury Compensation Program The [employer] Vaccine Injury Compensation Program (VICP) is a federal program that was created to  compensate people who may have been injured by certain vaccines. Claims regarding alleged injury or death due to vaccination have a time limit for filing, which may be as short as two years. Visit the VICP website at GoldCloset.com.ee or call 5011238567 to learn about the program and about filing a claim. 7. How can I learn more? Ask your health care provider. Call your local or state health department. Visit the website of the Food and Drug Administration (FDA) for vaccine package inserts and additional information at TraderRating.uy. Contact the Centers for Disease Control and Prevention (CDC): Call 310-462-3706 (1-800-CDC-INFO) or Visit CDC's website at https://gibson.com/. Vaccine Information Statement Inactivated Influenza Vaccine (10/15/2019) This information is not intended to replace advice given to you by your health care provider. Make sure you discuss any questions you have with your health care provider. Document Revised: 12/02/2019 Document Reviewed: 12/02/2019 Elsevier Patient Education  2022 [employer].

## 2020-12-06 ENCOUNTER — Ambulatory Visit (INDEPENDENT_AMBULATORY_CARE_PROVIDER_SITE_OTHER): Payer: BC Managed Care – PPO | Admitting: Obstetrics and Gynecology

## 2020-12-06 ENCOUNTER — Encounter: Payer: Self-pay | Admitting: Obstetrics and Gynecology

## 2020-12-06 ENCOUNTER — Other Ambulatory Visit: Payer: Self-pay

## 2020-12-06 VITALS — BP 127/75 | HR 69 | Ht 60.0 in | Wt 99.6 lb

## 2020-12-06 DIAGNOSIS — Z23 Encounter for immunization: Secondary | ICD-10-CM | POA: Diagnosis not present

## 2020-12-06 DIAGNOSIS — Z01419 Encounter for gynecological examination (general) (routine) without abnormal findings: Secondary | ICD-10-CM

## 2020-12-06 DIAGNOSIS — Z1231 Encounter for screening mammogram for malignant neoplasm of breast: Secondary | ICD-10-CM

## 2020-12-06 NOTE — Progress Notes (Signed)
Pt present for annual exam with new provider in office was a midwife patient. Pt stated that she was doing well other than having vaginal dryness. Flu vaccine administered and documented.

## 2020-12-06 NOTE — Progress Notes (Signed)
HPI:      [name] is a 55 y.o. [phone] who LMP was No LMP recorded. Patient is postmenopausal.  Subjective:   She presents today for her annual examination.  She has stopped using vaginal estrogen and has vaginal dryness.  She is using over-the-counter lubricants and home remedies and basically says "we'll work it out" -she is not interested in oral or vaginal estrogen. She is otherwise doing well without issue.    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 She does not have any pertinent problems on file. She  has a past surgical history that includes Tubal ligation; Cesarean section ([id number]); and Cholecystectomy (N/A, 06/05/2017). Her family history includes Breast cancer in her paternal grandmother; Breast cancer (age of onset: 12) in her sister; Heart disease in her father and mother. She  reports that she has never smoked. She has never used smokeless tobacco. She reports that she does not drink alcohol and does not use drugs. She has a current medication list which includes the following prescription(s): aspirin-acetaminophen-caffeine, loratadine, multivitamin, and vitamin e. She is allergic to meperidine hcl and meperidine.       Review of Systems:  Review of Systems  Constitutional: Denied constitutional symptoms, night sweats, recent illness, fatigue, fever, insomnia and weight loss.  Eyes: Denied eye symptoms, eye pain, photophobia, vision change and visual disturbance.   Ears/Nose/Throat/Neck: Denied ear, nose, throat or neck symptoms, hearing loss, nasal discharge, sinus congestion and sore throat.  Cardiovascular: Denied cardiovascular symptoms, arrhythmia, chest pain/pressure, edema, exercise intolerance, orthopnea and palpitations.  Respiratory: Denied pulmonary symptoms, asthma, pleuritic pain, productive sputum, cough, dyspnea and wheezing.  Gastrointestinal: Denied, gastro-esophageal reflux, melena, nausea and vomiting.  Genitourinary: See HPI for additional information.  Musculoskeletal: Denied musculoskeletal symptoms, stiffness, swelling, muscle weakness and myalgia.  Dermatologic: Denied dermatology symptoms, rash and scar.  Neurologic: Denied neurology symptoms, dizziness, headache, neck pain and syncope.  Psychiatric: Denied psychiatric symptoms, anxiety and depression.  Endocrine: Denied endocrine symptoms including hot flashes and night sweats.   Meds:   Current Outpatient Medications on File Prior to Visit  Medication Sig Dispense Refill   aspirin-acetaminophen-caffeine (EXCEDRIN MIGRAINE) 250-250-65 MG tablet Take 1 tablet by mouth daily as needed for headache.      loratadine (CLARITIN) 10 MG tablet Take 10 mg by mouth daily.     Multiple Vitamin (MULTIVITAMIN) tablet Take 1 tablet by mouth daily.     vitamin E 1000 UNIT capsule Take 1,000 Units by mouth daily.     No current facility-administered medications on file prior to visit.    Objective:     Vitals:   12/06/20 0809  BP: 127/75  Pulse: 69    Filed Weights   12/06/20 0809  Weight: 99 lb 9.6 oz (45.2 kg)              Physical examination General NAD, Conversant  HEENT Atraumatic; Op clear with mmm.  Normo-cephalic. Pupils reactive. Anicteric sclerae  Thyroid/Neck Smooth without nodularity or enlargement. Normal ROM.  Neck Supple.  Skin No rashes, lesions or ulceration. Normal palpated skin turgor.  No nodularity.  Breasts: No masses or discharge.  Symmetric.  No  axillary adenopathy.  Lungs: Clear to auscultation.No rales or wheezes. Normal Respiratory effort, no retractions.  Heart: NSR.  No murmurs or rubs appreciated. No periferal edema  Abdomen: Soft.  Non-tender.  No masses.  No HSM. No hernia  Extremities: Moves all appropriately.  Normal ROM for age. No lymphadenopathy.  Neuro: Oriented to PPT.  Normal mood. Normal affect.     Pelvic:   Vulva: Normal appearance.  No lesions.  Vagina: No lesions or abnormalities noted.  Moderate atrophy noted  Support: Normal pelvic support.  Urethra No masses tenderness or scarring.  Meatus Normal size without lesions or prolapse.  Cervix: Normal appearance.  No lesions.  Anus: Normal exam.  No lesions.  Perineum: Normal exam.  No lesions.        Bimanual   Uterus: Normal size.  Non-tender.  Mobile.  AV.  Adnexae: No masses.  Non-tender to palpation.  Cul-de-sac: Negative for abnormality.     Assessment:    G3P3003 Patient Active Problem List   Diagnosis Date Noted   Elevated cholesterol 11/19/2019   Acute sinus infection 07/12/2019   Other specified counseling 05/06/2019   Pre-employment health screening examination 05/06/2019   Colic, biliary    Seasonal allergic rhinitis 06/02/2017   Dyspareunia in female 10/24/2016   Vaginal atrophy 10/24/2016   Dense breast tissue on mammogram 10/24/2016   Elevated blood pressure reading 07/19/2016   Migraine headache with aura 07/19/2016   Situational mixed anxiety and depressive disorder 12/22/2014     1. Encounter for well woman exam with routine gynecological exam   2. Flu vaccine need   3. Breast cancer screening by mammogram     Patient generally doing well.  Some vaginal atrophy noted-patient not interested in estrogen supplementation.   Plan:            1.  Basic Screening Recommendations The basic screening recommendations for asymptomatic women were discussed with the patient during her visit.  The age-appropriate recommendations were  discussed with her and the rational for the tests reviewed.  When I am informed by the patient that another primary care physician has previously obtained the age-appropriate tests and they are up-to-date, only outstanding tests are ordered and referrals given as necessary.  Abnormal results of tests will be discussed with her when all of her results are completed.  Routine preventative health maintenance measures emphasized: Exercise/Diet/Weight control, Tobacco Warnings, Alcohol/Substance use risks and Stress Management Pap next year -annual lab work today -mammogram and DEXA ordered  2.  Discussed use of vaginal estrogen -patient declined, to inform us if she changes her mind or if her problems worsen. Orders Orders Placed This Encounter  Procedures   DG Bone Density   MM DIGITAL SCREENING BILATERAL   Flu Vaccine QUAD 5mo+IM (Fluarix, Fluzone & Alfiuria Quad PF)   Basic metabolic panel   CBC   Cologuard   Hemoglobin A1c   Lipid panel   TSH    No orders of the defined types were placed in this encounter.         F/U  Return in about 1 year (around 12/06/2021) for follow up in 1 year for annual exam, Annual Physical.  Elonda Husky, M.D. 12/06/2020 8:43 AM

## 2020-12-07 LAB — LIPID PANEL
Chol/HDL Ratio: 3.4 ratio (ref 0.0–4.4)
Cholesterol, Total: 186 mg/dL (ref 100–199)
HDL: 54 mg/dL (ref >39)
LDL Chol Calc (NIH): 119 mg/dL — ABNORMAL HIGH (ref 0–99)
Triglycerides: 69 mg/dL (ref 0–149)
VLDL Cholesterol Cal: 13 mg/dL (ref 5–40)

## 2020-12-07 LAB — BASIC METABOLIC PANEL
BUN/Creatinine Ratio: 20 (ref 9–23)
BUN: 17 mg/dL (ref 6–24)
CO2: 22 mmol/L (ref 20–29)
Calcium: 9.8 mg/dL (ref 8.7–10.2)
Chloride: 104 mmol/L (ref 96–106)
Creatinine, Ser: 0.85 mg/dL (ref 0.57–1.00)
Glucose: 89 mg/dL (ref 70–99)
Potassium: 5.2 mmol/L (ref 3.5–5.2)
Sodium: 142 mmol/L (ref 134–144)
eGFR: 80 mL/min/{1.73_m2} (ref >59)

## 2020-12-07 LAB — CBC
Hematocrit: 40.1 % (ref 34.0–46.6)
Hemoglobin: 13.1 g/dL (ref 11.1–15.9)
MCH: 29.3 pg (ref 26.6–33.0)
MCHC: 32.7 g/dL (ref 31.5–35.7)
MCV: 90 fL (ref 79–97)
Platelets: 196 10*3/uL (ref 150–450)
RBC: 4.47 x10E6/uL (ref 3.77–5.28)
RDW: 12.8 % (ref 11.7–15.4)
WBC: 5.9 10*3/uL (ref 3.4–10.8)

## 2020-12-07 LAB — HEMOGLOBIN A1C
Est. average glucose Bld gHb Est-mCnc: 105 mg/dL
Hgb A1c MFr Bld: 5.3 % (ref 4.8–5.6)

## 2020-12-07 LAB — TSH: TSH: 2.15 u[IU]/mL (ref 0.450–4.500)

## 2020-12-13 ENCOUNTER — Other Ambulatory Visit: Payer: Self-pay | Admitting: Obstetrics and Gynecology

## 2020-12-13 DIAGNOSIS — Z1231 Encounter for screening mammogram for malignant neoplasm of breast: Secondary | ICD-10-CM

## 2020-12-14 ENCOUNTER — Telehealth: Payer: Self-pay | Admitting: Obstetrics and Gynecology

## 2020-12-14 NOTE — Telephone Encounter (Signed)
[employer] called asking about an ICD 10 code, requesting a call back at [phone] Ref# [phone]

## 2020-12-21 NOTE — Telephone Encounter (Signed)
Called [employer], they were needing another ICD 10 code and to verify patient address. She may call back in regards to patients address. I tried to contact patient to verify correct address, no answer.

## 2021-01-15 ENCOUNTER — Ambulatory Visit
Admission: RE | Admit: 2021-01-15 | Discharge: 2021-01-15 | Disposition: A | Discharge status: Home / Self Care | Payer: BC Managed Care – PPO | Source: Ambulatory Visit | Attending: Obstetrics and Gynecology | Admitting: Obstetrics and Gynecology

## 2021-01-15 ENCOUNTER — Other Ambulatory Visit: Payer: Self-pay

## 2021-01-15 DIAGNOSIS — Z01419 Encounter for gynecological examination (general) (routine) without abnormal findings: Secondary | ICD-10-CM | POA: Insufficient documentation

## 2021-01-15 DIAGNOSIS — Z1231 Encounter for screening mammogram for malignant neoplasm of breast: Secondary | ICD-10-CM | POA: Insufficient documentation

## 2021-01-22 NOTE — Progress Notes (Signed)
Your Dexa scan shows that you have Osteoporosis.  There are several strategies to manage this. Please make an appointment or video visit to discuss this. Thanks

## 2021-01-28 LAB — COLOGUARD: COLOGUARD: NEGATIVE

## 2021-12-07 ENCOUNTER — Encounter: Payer: BC Managed Care – PPO | Admitting: Obstetrics and Gynecology

## 2021-12-11 NOTE — Progress Notes (Unsigned)
ANNUAL PREVENTATIVE CARE GYNECOLOGY  ENCOUNTER NOTE  Subjective:       Jeanne Ramos is a 56 y.o. G44P3003 female here for a routine annual gynecologic exam. The patient {is/is not/has never been:13135} sexually active. The patient is not taking hormone replacement therapy. {post-men bleed:13152::"Patient denies post-menopausal vaginal bleeding."} The patient wears seatbelts: {yes/no:311178}. The patient participates in regular exercise: {yes/no/not asked:9010}. Has the patient ever been transfused or tattooed?: {yes/no/not asked:9010}. The patient reports that there {is/is not:9024} domestic violence in her life.  Current complaints: 1.  ***    Gynecologic History No LMP recorded. Patient is postmenopausal. Contraception: post menopausal status Last Pap: 11/18/2018. Results were: normal Last mammogram: 01/15/2021. Results were: normal Last Colonoscopy:  Last Dexa Scan:    Obstetric History OB History  Gravida Para Term Preterm AB Living  3 3 3     3   SAB IAB Ectopic Multiple Live Births          3    # Outcome Date GA Lbr Len/2nd Weight Sex Delivery Anes PTL Lv  3 Term 11/12/97    M CS-Unspec   LIV  2 Term 08/05/91    M CS-Unspec   LIV  1 Term 06/12/88    M CS-Unspec   LIV    Past Medical History:  Diagnosis Date   Allergy    Anxiety    Dense breast tissue on mammogram 10/24/2016   Depression    Dyspareunia in female 10/24/2016   Elevated blood pressure reading 07/19/2016   Frequent headaches    Irregular menses    Migraine headache with aura 07/19/2016   Migraines    Seasonal allergic rhinitis 06/02/2017   Situational mixed anxiety and depressive disorder 12/22/2014   Vaginal atrophy 10/24/2016    Family History  Problem Relation Age of Onset   Breast cancer Sister 36   Breast cancer Paternal Grandmother    Heart disease Mother    Heart disease Father     Past Surgical History:  Procedure Laterality Date   CESAREAN SECTION  [phone]    CHOLECYSTECTOMY N/A 06/05/2017   Procedure: LAPAROSCOPIC CHOLECYSTECTOMY;  Surgeon: Jules Husbands, MD;  Location: [facility] ORS;  Service: General;  Laterality: N/A;   TUBAL LIGATION      Social History   Socioeconomic History   Marital status: Married    Spouse name: Not on file   Number of children: 3   Years of education: 16   Highest education level: Not on file  Occupational History   Occupation: Educator  Tobacco Use   Smoking status: Never   Smokeless tobacco: Never  Vaping Use   Vaping Use: Never used  Substance and Sexual Activity   Alcohol use: No   Drug use: No   Sexual activity: Yes    Birth control/protection: Post-menopausal  Other Topics Concern   Not on file  Social History Narrative   Fun/Hobby: read and sew   Denies abuse and feels safe at home   Social Determinants of Health   Financial Resource Strain: Not on file  Food Insecurity: Not on file  Transportation Needs: Not on file  Physical Activity: Not on file  Stress: Not on file  Social Connections: Not on file  Intimate Partner Violence: Not on file    Current Outpatient Medications on File Prior to Visit  Medication Sig Dispense Refill   aspirin-acetaminophen-caffeine (EXCEDRIN MIGRAINE) 250-250-65 MG tablet Take 1 tablet by mouth daily as needed for headache.  loratadine (CLARITIN) 10 MG tablet Take 10 mg by mouth daily.     Multiple Vitamin (MULTIVITAMIN) tablet Take 1 tablet by mouth daily.     vitamin E 1000 UNIT capsule Take 1,000 Units by mouth daily.     No current facility-administered medications on file prior to visit.    Allergies  Allergen Reactions   Meperidine Hcl     Other reaction(s): Edema   Meperidine Swelling      Review of Systems ROS Review of Systems - General ROS: negative for - chills, fatigue, fever, hot flashes, night sweats, weight gain or weight loss Psychological ROS: negative for - anxiety, decreased libido, depression, mood swings, physical abuse or  sexual abuse Ophthalmic ROS: negative for - blurry vision, eye pain or loss of vision ENT ROS: negative for - headaches, hearing change, visual changes or vocal changes Allergy and Immunology ROS: negative for - hives, itchy/watery eyes or seasonal allergies Hematological and Lymphatic ROS: negative for - bleeding problems, bruising, swollen lymph nodes or weight loss Endocrine ROS: negative for - galactorrhea, hair pattern changes, hot flashes, malaise/lethargy, mood swings, palpitations, polydipsia/polyuria, skin changes, temperature intolerance or unexpected weight changes Breast ROS: negative for - new or changing breast lumps or nipple discharge Respiratory ROS: negative for - cough or shortness of breath Cardiovascular ROS: negative for - chest pain, irregular heartbeat, palpitations or shortness of breath Gastrointestinal ROS: no abdominal pain, change in bowel habits, or black or bloody stools Genito-Urinary ROS: no dysuria, trouble voiding, or hematuria Musculoskeletal ROS: negative for - joint pain or joint stiffness Neurological ROS: negative for - bowel and bladder control changes Dermatological ROS: negative for rash and skin lesion changes   Objective:   There were no vitals taken for this visit. CONSTITUTIONAL: Well-developed, well-nourished female in no acute distress.  PSYCHIATRIC: Normal mood and affect. Normal behavior. Normal judgment and thought content. [place]: Alert and oriented to person, place, and time. Normal muscle tone coordination. No cranial nerve deficit noted. HENT:  Normocephalic, atraumatic, External right and left ear normal. Oropharynx is clear and moist EYES: Conjunctivae and EOM are normal. Pupils are equal, round, and reactive to light. No scleral icterus.  NECK: Normal range of motion, supple, no masses.  Normal thyroid.  SKIN: Skin is warm and dry. No rash noted. Not diaphoretic. No erythema. No pallor. CARDIOVASCULAR: Normal heart rate noted,  regular rhythm, no murmur. RESPIRATORY: Clear to auscultation bilaterally. Effort and breath sounds normal, no problems with respiration noted. BREASTS: Symmetric in size. No masses, skin changes, nipple drainage, or lymphadenopathy. ABDOMEN: Soft, normal bowel sounds, no distention noted.  No tenderness, rebound or guarding.  BLADDER: Normal PELVIC:  Bladder {:311640}  Urethra: {:311719}  Vulva: {:311722}  Vagina: {:311643}  Cervix: {:311644}  Uterus: {:311718}  Adnexa: {:311645}  RV: {Blank multiple:19196::"External Exam NormaI","No Rectal Masses","Normal Sphincter tone"}  MUSCULOSKELETAL: Normal range of motion. No tenderness.  No cyanosis, clubbing, or edema.  2+ distal pulses. LYMPHATIC: No Axillary, Supraclavicular, or Inguinal Adenopathy.   Labs: Lab Results  Component Value Date   WBC 5.9 12/06/2020   HGB 13.1 12/06/2020   HCT 40.1 12/06/2020   MCV 90 12/06/2020   PLT 196 12/06/2020    Lab Results  Component Value Date   CREATININE 0.85 12/06/2020   BUN 17 12/06/2020   NA 142 12/06/2020   K 5.2 12/06/2020   CL 104 12/06/2020   CO2 22 12/06/2020    Lab Results  Component Value Date   ALT 23 11/19/2019  AST 19 11/19/2019   ALKPHOS 40 (L) 11/19/2019   BILITOT 0.5 11/19/2019    Lab Results  Component Value Date   CHOL 186 12/06/2020   HDL 54 12/06/2020   LDLCALC 119 (H) 12/06/2020   TRIG 69 12/06/2020   CHOLHDL 3.4 12/06/2020    Lab Results  Component Value Date   TSH 2.150 12/06/2020    Lab Results  Component Value Date   HGBA1C 5.3 12/06/2020     Assessment:   No diagnosis found.   Plan:  Pap:  Ordered Mammogram: Ordered Colon Screening:  Not Indicated Labs: {Blank multiple:19196::"Lipid 1","FBS","TSH","Hemoglobin A1C","Vit D Level""***"} Routine preventative health maintenance measures emphasized: Exercise/Diet/Weight control, Tobacco Warnings, Alcohol/Substance use risks, Stress Management, Peer Pressure Issues, and Safe Sex COVID  Vaccination status: Return to [place], MD [facility]

## 2021-12-12 ENCOUNTER — Ambulatory Visit (INDEPENDENT_AMBULATORY_CARE_PROVIDER_SITE_OTHER): Payer: BC Managed Care – PPO | Admitting: Obstetrics and Gynecology

## 2021-12-12 ENCOUNTER — Encounter: Payer: Self-pay | Admitting: Obstetrics and Gynecology

## 2021-12-12 ENCOUNTER — Other Ambulatory Visit (HOSPITAL_COMMUNITY)
Admission: RE | Admit: 2021-12-12 | Discharge: 2021-12-12 | Disposition: A | Discharge status: Home / Self Care | Payer: BC Managed Care – PPO | Source: Ambulatory Visit | Attending: Obstetrics and Gynecology | Admitting: Obstetrics and Gynecology

## 2021-12-12 VITALS — BP 124/72 | Resp 16 | Ht 60.0 in | Wt 104.5 lb

## 2021-12-12 DIAGNOSIS — Z1159 Encounter for screening for other viral diseases: Secondary | ICD-10-CM | POA: Diagnosis not present

## 2021-12-12 DIAGNOSIS — Z01411 Encounter for gynecological examination (general) (routine) with abnormal findings: Secondary | ICD-10-CM

## 2021-12-12 DIAGNOSIS — Z8349 Family history of other endocrine, nutritional and metabolic diseases: Secondary | ICD-10-CM | POA: Diagnosis not present

## 2021-12-12 DIAGNOSIS — N952 Postmenopausal atrophic vaginitis: Secondary | ICD-10-CM

## 2021-12-12 DIAGNOSIS — Z23 Encounter for immunization: Secondary | ICD-10-CM

## 2021-12-12 DIAGNOSIS — Z8262 Family history of osteoporosis: Secondary | ICD-10-CM

## 2021-12-12 DIAGNOSIS — Z124 Encounter for screening for malignant neoplasm of cervix: Secondary | ICD-10-CM

## 2021-12-12 DIAGNOSIS — Z01419 Encounter for gynecological examination (general) (routine) without abnormal findings: Secondary | ICD-10-CM | POA: Diagnosis not present

## 2021-12-12 DIAGNOSIS — Z1231 Encounter for screening mammogram for malignant neoplasm of breast: Secondary | ICD-10-CM

## 2021-12-12 DIAGNOSIS — M816 Localized osteoporosis [Lequesne]: Secondary | ICD-10-CM

## 2021-12-13 LAB — COMPREHENSIVE METABOLIC PANEL
ALT: 16 IU/L (ref 0–32)
AST: 21 IU/L (ref 0–40)
Albumin/Globulin Ratio: 2.6 — ABNORMAL HIGH (ref 1.2–2.2)
Albumin: 4.5 g/dL (ref 3.8–4.9)
Alkaline Phosphatase: 35 IU/L — ABNORMAL LOW (ref 44–121)
BUN/Creatinine Ratio: 17 (ref 9–23)
BUN: 16 mg/dL (ref 6–24)
Bilirubin Total: 0.3 mg/dL (ref 0.0–1.2)
CO2: 23 mmol/L (ref 20–29)
Calcium: 9.5 mg/dL (ref 8.7–10.2)
Chloride: 105 mmol/L (ref 96–106)
Creatinine, Ser: 0.92 mg/dL (ref 0.57–1.00)
Globulin, Total: 1.7 g/dL (ref 1.5–4.5)
Glucose: 90 mg/dL (ref 70–99)
Potassium: 3.8 mmol/L (ref 3.5–5.2)
Sodium: 142 mmol/L (ref 134–144)
Total Protein: 6.2 g/dL (ref 6.0–8.5)
eGFR: 73 mL/min/{1.73_m2} (ref >59)

## 2021-12-13 LAB — CBC
Hematocrit: 39.6 % (ref 34.0–46.6)
Hemoglobin: 13.1 g/dL (ref 11.1–15.9)
MCH: 29.9 pg (ref 26.6–33.0)
MCHC: 33.1 g/dL (ref 31.5–35.7)
MCV: 90 fL (ref 79–97)
Platelets: 220 10*3/uL (ref 150–450)
RBC: 4.38 x10E6/uL (ref 3.77–5.28)
RDW: 12.9 % (ref 11.7–15.4)
WBC: 5.4 10*3/uL (ref 3.4–10.8)

## 2021-12-13 LAB — HEMOGLOBIN A1C
Est. average glucose Bld gHb Est-mCnc: 103 mg/dL
Hgb A1c MFr Bld: 5.2 % (ref 4.8–5.6)

## 2021-12-13 LAB — LIPID PANEL
Chol/HDL Ratio: 3.1 ratio (ref 0.0–4.4)
Cholesterol, Total: 201 mg/dL — ABNORMAL HIGH (ref 100–199)
HDL: 65 mg/dL (ref >39)
LDL Chol Calc (NIH): 124 mg/dL — ABNORMAL HIGH (ref 0–99)
Triglycerides: 64 mg/dL (ref 0–149)
VLDL Cholesterol Cal: 12 mg/dL (ref 5–40)

## 2021-12-13 LAB — TSH: TSH: 2.11 u[IU]/mL (ref 0.450–4.500)

## 2021-12-13 LAB — HEPATITIS C ANTIBODY: Hep C Virus Ab: NONREACTIVE

## 2021-12-13 LAB — VITAMIN D 25 HYDROXY (VIT D DEFICIENCY, FRACTURES): Vit D, 25-Hydroxy: 74.3 ng/mL (ref 30.0–100.0)

## 2021-12-18 LAB — CYTOLOGY - PAP
Comment: NEGATIVE
Diagnosis: NEGATIVE
High risk HPV: NEGATIVE

## 2022-02-07 IMAGING — MG MM DIGITAL SCREENING BILAT W/ TOMO AND CAD
8 series · 8 of 24 positions shown · non-contrast
Comparison: Previous exam(s).

CLINICAL DATA: Screening.

EXAM:
DIGITAL SCREENING BILATERAL MAMMOGRAM WITH TOMOSYNTHESIS AND CAD
TECHNIQUE: Bilateral screening digital craniocaudal and mediolateral oblique
mammograms were obtained. Bilateral screening digital breast
tomosynthesis was performed. The images were evaluated with
computer-aided detection.

[R CC synth-2D]
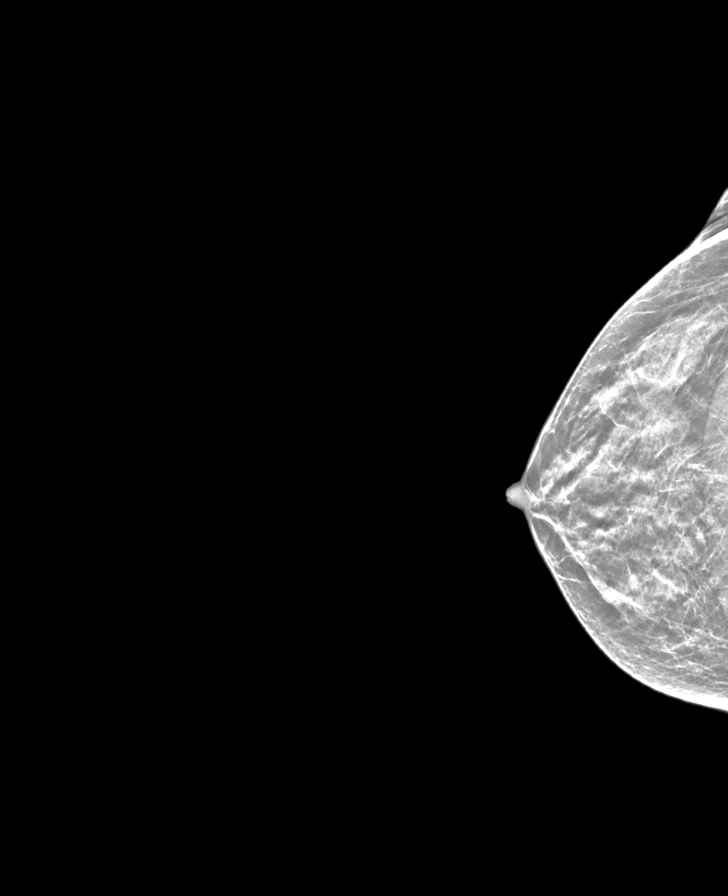

[R MLO synth-2D]
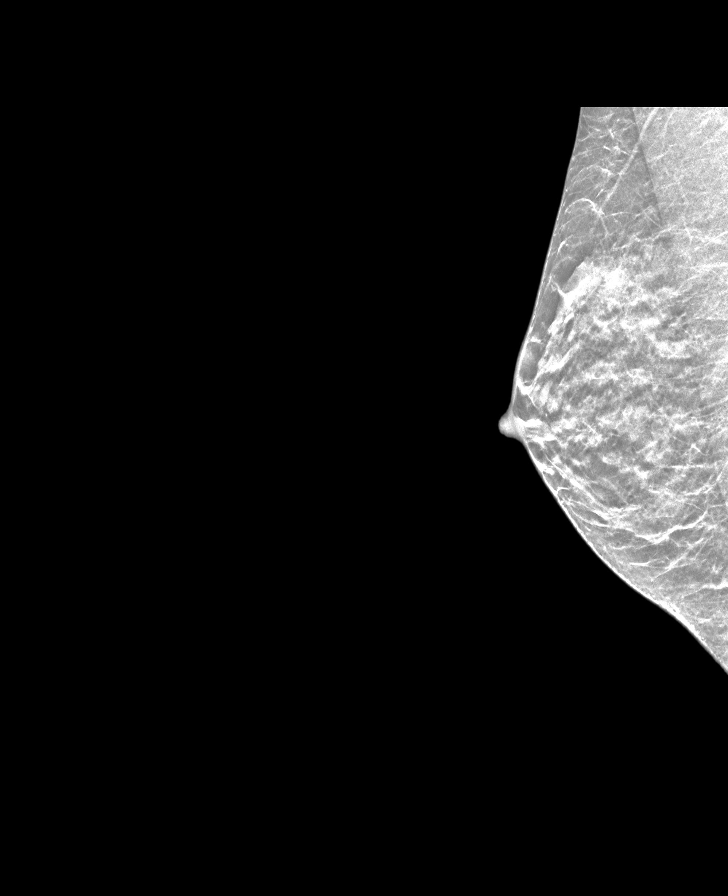

[L CC synth-2D]
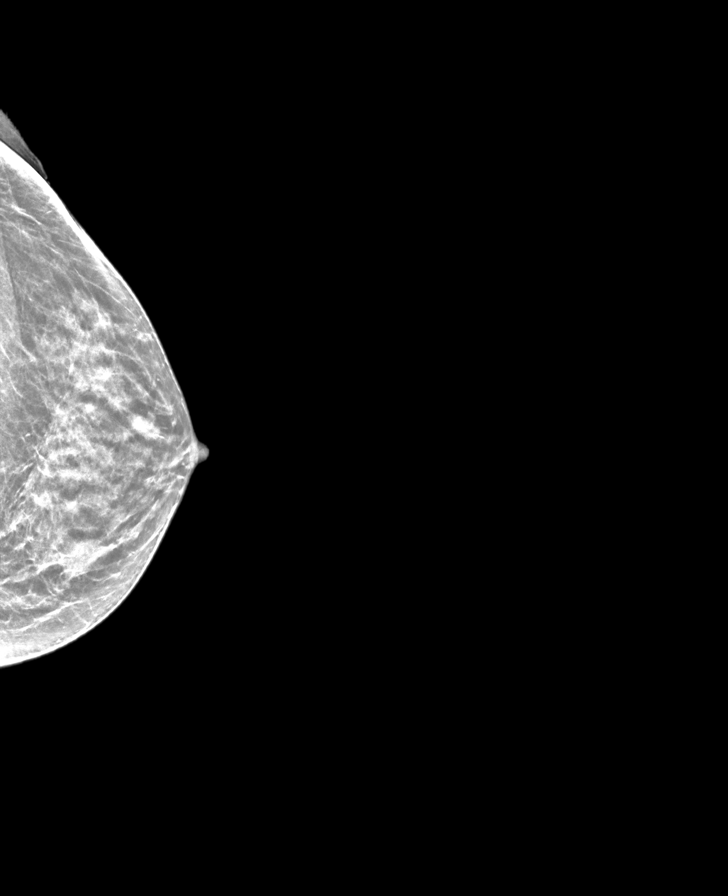

[L MLO synth-2D]
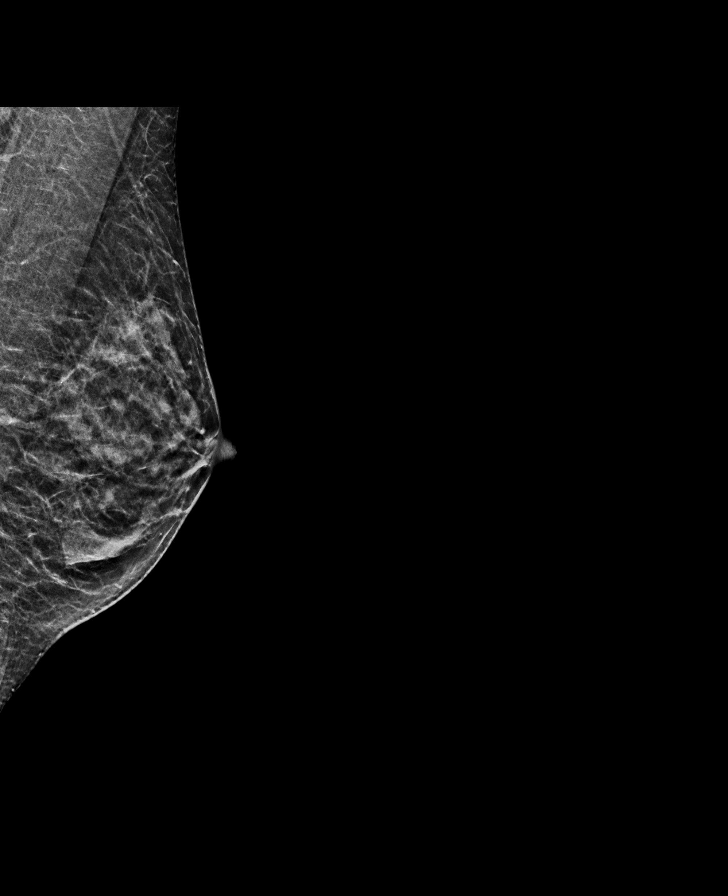

[R CC tomo · tomo slice 20/39.0]
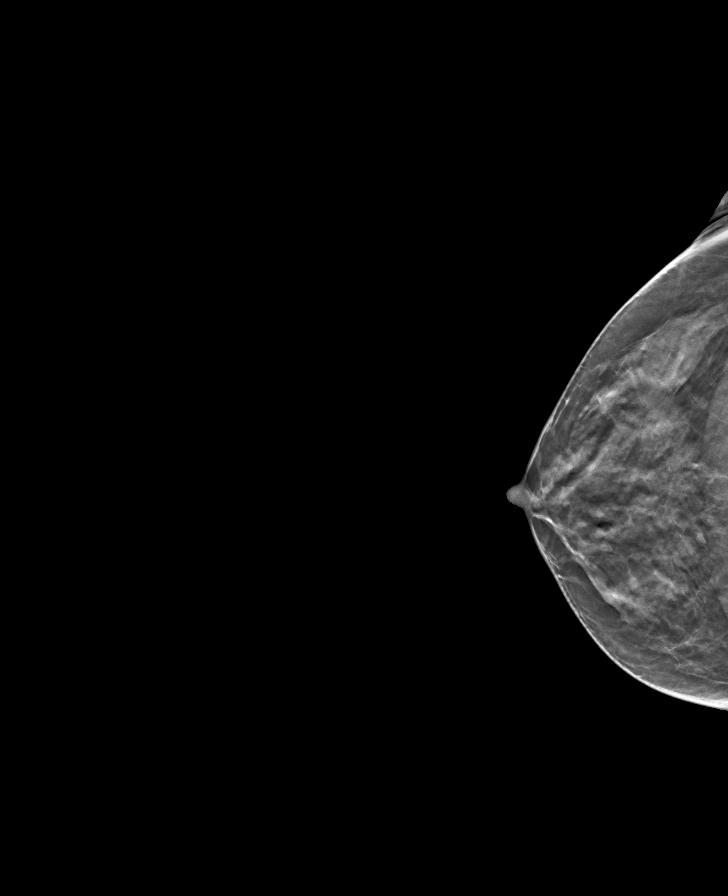

[L MLO tomo · tomo slice 21/40.0]
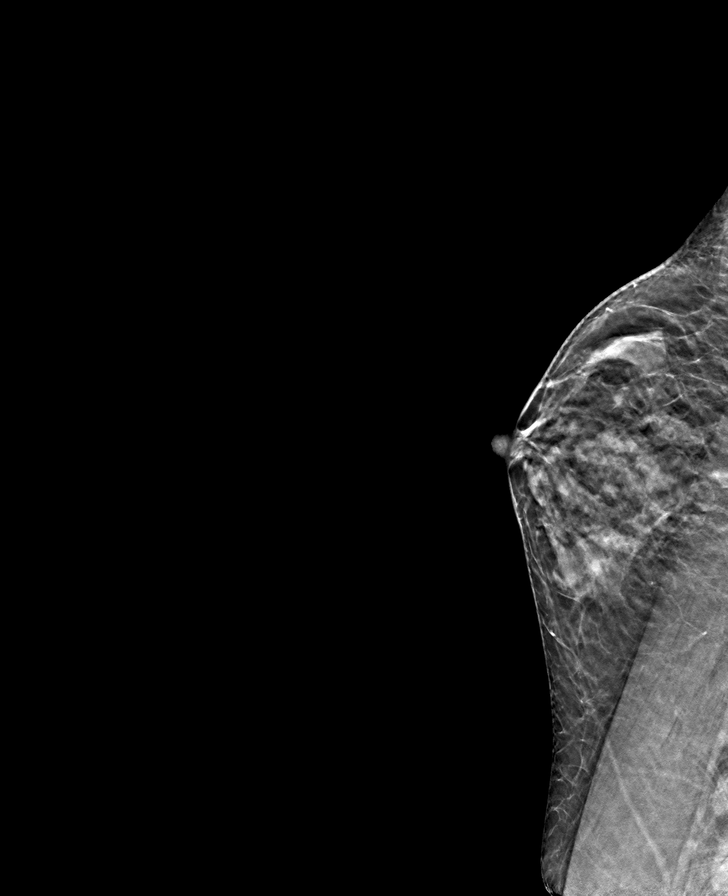

[L CC tomo · tomo slice 23/46.0]
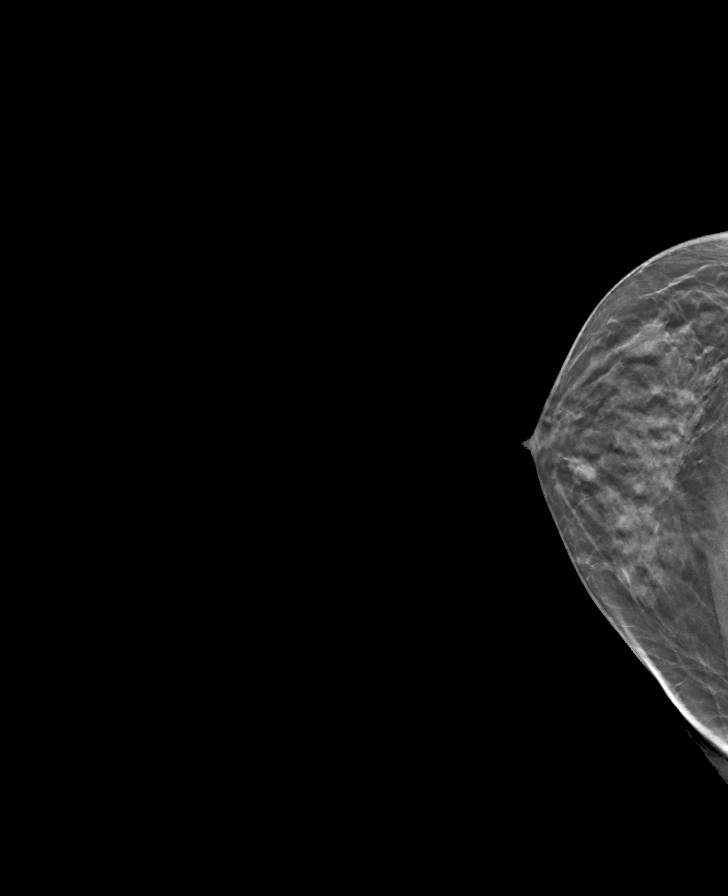

[R MLO tomo · tomo slice 20/39.0]
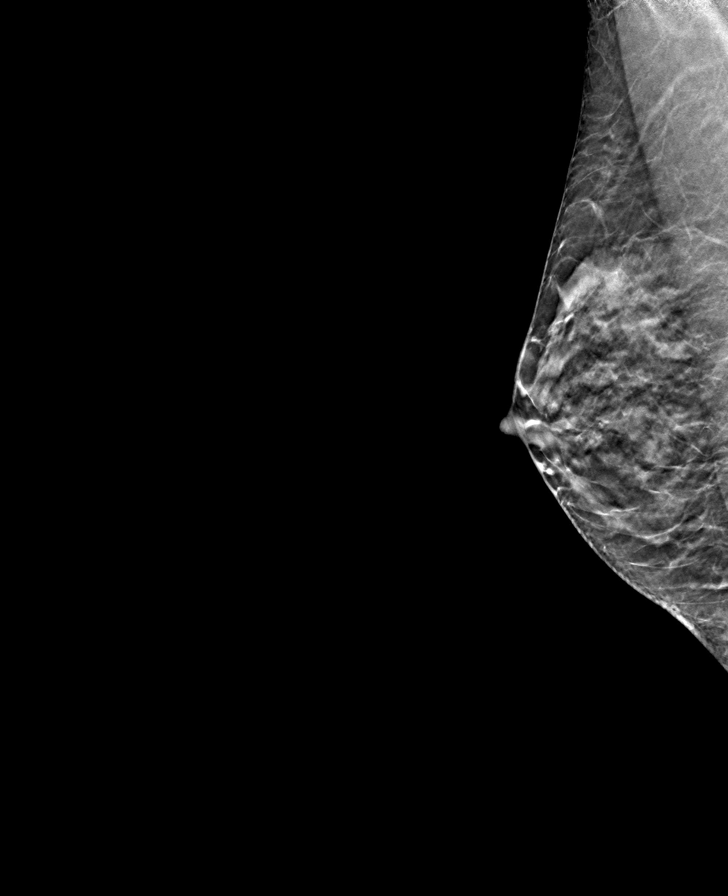

[8 of 24 positions shown; findings below may reference images not displayed]

ACR Breast Density Category c: The breast tissue is heterogeneously
dense, which may obscure small masses.
FINDINGS: There are no findings suspicious for malignancy.
IMPRESSION: No mammographic evidence of malignancy. A result letter of this
screening mammogram will be mailed directly to the patient.

RECOMMENDATION:
Screening mammogram in one year. (Code:Q3-W-BC3)

BI-RADS CATEGORY  1: Negative.

## 2022-03-26 ENCOUNTER — Ambulatory Visit
Admission: RE | Admit: 2022-03-26 | Discharge: 2022-03-26 | Disposition: A | Discharge status: Home / Self Care | Payer: BC Managed Care – PPO | Source: Ambulatory Visit | Attending: Obstetrics and Gynecology | Admitting: Obstetrics and Gynecology

## 2022-03-26 DIAGNOSIS — Z01419 Encounter for gynecological examination (general) (routine) without abnormal findings: Secondary | ICD-10-CM | POA: Diagnosis not present

## 2022-03-26 DIAGNOSIS — Z1231 Encounter for screening mammogram for malignant neoplasm of breast: Secondary | ICD-10-CM | POA: Insufficient documentation

## 2023-09-23 ENCOUNTER — Ambulatory Visit

## 2023-09-23 VITALS — BP 118/78 | HR 83 | Ht 60.0 in | Wt 107.0 lb

## 2023-09-23 DIAGNOSIS — G43109 Migraine with aura, not intractable, without status migrainosus: Secondary | ICD-10-CM | POA: Diagnosis not present

## 2023-09-23 DIAGNOSIS — J3089 Other allergic rhinitis: Secondary | ICD-10-CM | POA: Diagnosis not present

## 2023-09-23 NOTE — Assessment & Plan Note (Signed)
 Plan for her to continue to use Excedrin as needed.  If migraine frequency picks up, she will follow-up.  Current management adequate.

## 2023-09-23 NOTE — Assessment & Plan Note (Signed)
 Patient to use over-the-counter medication/Sudafed as needed.

## 2023-09-23 NOTE — Progress Notes (Signed)
 New Patient Visit   Patient: Jeanne Ramos   DOB: 1965/07/27   58 y.o. Female  MRN: [id number] Visit Date: 09/23/2023  Today's healthcare provider: Marabelle Cushman A Timm Bonenberger, MD   Chief Complaint  Patient presents with   Establish Care   Subjective  Jeanne Ramos is a 58 y.o. female who presents today as a new patient to establish care.   HPI   Patient seen to establish care.  That she has a history of migraines with aura which have been present for a number of years.  She does have them a few times a week.  She notes that these are generally well-controlled with use of Excedrin Migraine.  Migraines associated with change in weather and stress in her case.  Patient has a history of allergic rhinitis for which she uses over-the-counter Sudafed as needed.  Seasonal in general.  Patient is in generally good health and does exercise on a regular basis.  She does lift weights on a regular basis.  Diet is healthy consisting fruits vegetables and adequate protein.+  Family history significant for coronary artery disease.  Patient is non-smoker without significant alcohol or drug use.  She does not normally take vaccines.  Patient had Cologuard 2 years prior.  She is due for mammogram screening.     Assessment & Plan   Problem List Items Addressed This Visit       Cardiovascular and Mediastinum   Migraine headache with aura - Primary   Plan for her to continue to use Excedrin as needed.  If migraine frequency picks up, she will follow-up.  Current management adequate.        Respiratory   Seasonal allergic rhinitis   Patient may continue to use over-the-counter medication as needed.  Patient basic labs with lipids and hemoglobin A1c.  Plan for mammogram screening order.  Follow-up in 4 to 5 weeks to review labs    Objective    BP 118/78   Pulse 83   Ht 5' (1.524 m)   Wt 107 lb (48.5 kg)   SpO2 99%   BMI 20.90 kg/m      Review of Systems  Constitutional:   Negative for chills, fever and weight loss.  Eyes:  Negative for blurred vision.  Respiratory:  Negative for cough and shortness of breath.   Cardiovascular:  Negative for chest pain and palpitations.  Skin:  Negative for rash.  Psychiatric/Behavioral:  Negative for depression. The patient is not nervous/anxious.      Physical Exam Physical Exam Vitals reviewed.  Constitutional:      Appearance: Normal appearance. Well-developed with normal weight.  HENT:     Head: Normocephalic and atraumatic.  Normal mucous membranes, no oral lesions Eyes:     Pupils: Pupils are equal, round, and reactive to light.  Neck:     Thyroid: No thyroid mass or thyromegaly.  Cardiovascular:     Rate and Rhythm: Normal rate and regular rhythm. Normal heart sounds. Normal peripheral pulses Pulmonary:     Normal breath sounds with normal effort Abdominal:   Abdomen is soft, without tenderness or noted hepatosplenomegaly Musculoskeletal:        General: No swelling or edema  Lymphadenopathy:     Cervical: No cervical adenopathy.  Skin:    General: Skin is warm and dry without noticeable rash. Neurological:     General: No focal deficit present.  Psychiatric:        Mood and Affect: Mood, behavior and  cognition normal   Past Medical History:  Diagnosis Date   Allergy    Anxiety    Closed fracture of fifth metatarsal bone 04/06/2018   Dense breast tissue on mammogram 10/24/2016   Depression    Dyspareunia in female 10/24/2016   Elevated blood pressure reading 07/19/2016   Frequent headaches    Irregular menses    Migraine headache with aura 07/19/2016   Migraines    Osteopetrosis    Seasonal allergic rhinitis 06/02/2017   Situational mixed anxiety and depressive disorder 12/22/2014   Vaginal atrophy 10/24/2016   Past Surgical History:  Procedure Laterality Date   CESAREAN SECTION  [phone]   CHOLECYSTECTOMY N/A 06/05/2017   Procedure: LAPAROSCOPIC CHOLECYSTECTOMY;  Surgeon: Jordis Laneta FALCON, MD;  Location: [facility] ORS;  Service: General;  Laterality: N/A;   TUBAL LIGATION     Family Status  Relation Name Status   Sister  Alive   PGM  (Not Specified)   Mother  Alive   Father  Deceased  No partnership data on file   Family History  Problem Relation Age of Onset   Breast cancer Sister 58   Breast cancer Paternal Grandmother    Heart disease Mother    Heart disease Father    Social History   Socioeconomic History   Marital status: Married    Spouse name: Not on file   Number of children: 3   Years of education: 16   Highest education level: Not on file  Occupational History   Occupation: Programmer, systems  Tobacco Use   Smoking status: Never   Smokeless tobacco: Never  Vaping Use   Vaping status: Never Used  Substance and Sexual Activity   Alcohol use: No   Drug use: No   Sexual activity: Yes    Birth control/protection: Post-menopausal  Other Topics Concern   Not on file  Social History Narrative   Fun/Hobby: read and sew   Denies abuse and feels safe at home   Social Drivers of Health   Financial Resource Strain: Not on file  Food Insecurity: Not on file  Transportation Needs: Not on file  Physical Activity: Not on file  Stress: Not on file  Social Connections: Not on file   Outpatient Medications Prior to Visit  Medication Sig   aspirin-acetaminophen -caffeine (EXCEDRIN MIGRAINE) 250-250-65 MG tablet Take 1 tablet by mouth daily as needed for headache.    B Complex Vitamins (B COMPLEX PO) Take by mouth.   KRILL OIL PO Take by mouth.   Vitamin D -Vitamin K (VITAMIN K2-VITAMIN D3 PO) Take by mouth.   Zinc-Magnesium Aspart-Vit B6 (ZINC MAGNESIUM ASPARTATE PO) Take by mouth.   [DISCONTINUED] loratadine (CLARITIN) 10 MG tablet Take 10 mg by mouth daily.   [DISCONTINUED] Multiple Vitamin (MULTIVITAMIN) tablet Take 1 tablet by mouth daily.   [DISCONTINUED] vitamin E 1000 UNIT capsule Take 1,000 Units by mouth daily.   No facility-administered  medications prior to visit.   Allergies  Allergen Reactions   Meperidine Hcl     Other reaction(s): Edema   Meperidine Swelling    Immunization History  Administered Date(s) Administered   Influenza,inj,Quad PF,6+ Mos 11/18/2018, 12/06/2020, 12/12/2021   MMR 05/11/2019, 06/09/2019   PPD Test 05/07/2019   Tdap 12/21/2015    Health Maintenance  Topic Date Due   HIV Screening  Never done   Hepatitis B Vaccines (1 of 3 - 19+ 3-dose series) Never done   Colonoscopy  Never done   Zoster Vaccines- Shingrix (1 of 2)  Never done   COVID-19 Vaccine (1 - 2024-25 season) Never done   INFLUENZA VACCINE  10/10/2023   MAMMOGRAM  03/26/2024   DTaP/Tdap/Td (2 - Td or Tdap) 12/20/2025   Cervical Cancer Screening (HPV/Pap Cotest)  12/13/2026   Hepatitis C Screening  Completed   HPV VACCINES  Aged Out   Meningococcal B Vaccine  Aged Out    Patient Care Team: Vitaliy Eisenhour A, MD as PCP - [facility] ([facility], [facility]  Depression Screen    09/23/2023    8:21 AM 12/12/2021    8:57 AM  PHQ 2/9 Scores  PHQ - 2 Score 0 0  PHQ- 9 Score 2      Parris DELENA Juneau, MD  [facility] [phone] (phone) [phone] (fax)  [facility]

## 2023-09-30 ENCOUNTER — Other Ambulatory Visit: Payer: Self-pay

## 2023-09-30 DIAGNOSIS — J3089 Other allergic rhinitis: Secondary | ICD-10-CM

## 2023-09-30 DIAGNOSIS — G43109 Migraine with aura, not intractable, without status migrainosus: Secondary | ICD-10-CM

## 2023-10-01 ENCOUNTER — Other Ambulatory Visit

## 2023-10-02 ENCOUNTER — Ambulatory Visit: Payer: Self-pay

## 2023-10-06 LAB — URINALYSIS, ROUTINE W REFLEX MICROSCOPIC
Bilirubin Urine: NEGATIVE
Glucose, UA: NEGATIVE
Hgb urine dipstick: NEGATIVE
Ketones, ur: NEGATIVE
Leukocytes,Ua: NEGATIVE
Nitrite: NEGATIVE
Protein, ur: NEGATIVE
Specific Gravity, Urine: 1.005 (ref 1.001–1.035)
pH: 7 (ref 5.0–8.0)

## 2023-10-06 LAB — LIPOPROTEIN A (LPA): Lipoprotein (a): 134 nmol/L — ABNORMAL HIGH (ref <75)

## 2023-10-06 LAB — COMPREHENSIVE METABOLIC PANEL WITH GFR
AG Ratio: 2.3 (calc) (ref 1.0–2.5)
ALT: 21 U/L (ref 6–29)
AST: 21 U/L (ref 10–35)
Albumin: 4.8 g/dL (ref 3.6–5.1)
Alkaline phosphatase (APISO): 34 U/L — ABNORMAL LOW (ref 37–153)
BUN: 20 mg/dL (ref 7–25)
CO2: 28 mmol/L (ref 20–32)
Calcium: 9.7 mg/dL (ref 8.6–10.4)
Chloride: 101 mmol/L (ref 98–110)
Creat: 0.88 mg/dL (ref 0.50–1.03)
Globulin: 2.1 g/dL (ref 1.9–3.7)
Glucose, Bld: 87 mg/dL (ref 65–99)
Potassium: 4 mmol/L (ref 3.5–5.3)
Sodium: 139 mmol/L (ref 135–146)
Total Bilirubin: 0.7 mg/dL (ref 0.2–1.2)
Total Protein: 6.9 g/dL (ref 6.1–8.1)
eGFR: 76 mL/min/1.73m2 (ref >=60)

## 2023-10-06 LAB — CBC WITH DIFFERENTIAL/PLATELET
Absolute Lymphocytes: 1510 {cells}/uL (ref 850–3900)
Absolute Monocytes: 510 {cells}/uL (ref 200–950)
Basophils Absolute: 31 {cells}/uL (ref 0–200)
Basophils Relative: 0.6 %
Eosinophils Absolute: 82 {cells}/uL (ref 15–500)
Eosinophils Relative: 1.6 %
HCT: 41.6 % (ref 35.0–45.0)
Hemoglobin: 13.6 g/dL (ref 11.7–15.5)
MCH: 30.3 pg (ref 27.0–33.0)
MCHC: 32.7 g/dL (ref 32.0–36.0)
MCV: 92.7 fL (ref 80.0–100.0)
MPV: 11 fL (ref 7.5–12.5)
Monocytes Relative: 10 %
Neutro Abs: 2968 {cells}/uL (ref 1500–7800)
Neutrophils Relative %: 58.2 %
Platelets: 210 Thousand/uL (ref 140–400)
RBC: 4.49 Million/uL (ref 3.80–5.10)
RDW: 12.5 % (ref 11.0–15.0)
Total Lymphocyte: 29.6 %
WBC: 5.1 Thousand/uL (ref 3.8–10.8)

## 2023-10-06 LAB — HEMOGLOBIN A1C
Hgb A1c MFr Bld: 5.2 % (ref <5.7)
Mean Plasma Glucose: 103 mg/dL
eAG (mmol/L): 5.7 mmol/L

## 2023-10-06 LAB — IRON,TIBC AND FERRITIN PANEL
%SAT: 34 % (ref 16–45)
Ferritin: 52 ng/mL (ref 16–232)
Iron: 113 ug/dL (ref 45–160)
TIBC: 331 ug/dL (ref 250–450)

## 2023-10-06 LAB — LIPID PANEL
Cholesterol: 248 mg/dL — ABNORMAL HIGH (ref <200)
HDL: 68 mg/dL (ref >=50)
LDL Cholesterol (Calc): 159 mg/dL — ABNORMAL HIGH
Non-HDL Cholesterol (Calc): 180 mg/dL — ABNORMAL HIGH (ref <130)
Total CHOL/HDL Ratio: 3.6 (calc) (ref <5.0)
Triglycerides: 98 mg/dL (ref <150)

## 2023-10-06 LAB — TSH+FREE T4: TSH W/REFLEX TO FT4: 1.86 m[IU]/L (ref 0.40–4.50)

## 2023-10-14 ENCOUNTER — Ambulatory Visit

## 2023-10-14 ENCOUNTER — Other Ambulatory Visit: Payer: Self-pay

## 2023-10-14 VITALS — BP 130/80 | HR 86 | Ht 60.0 in | Wt 105.2 lb

## 2023-10-14 DIAGNOSIS — E7800 Pure hypercholesterolemia, unspecified: Secondary | ICD-10-CM

## 2023-10-14 DIAGNOSIS — E7841 Elevated Lipoprotein(a): Secondary | ICD-10-CM | POA: Diagnosis not present

## 2023-10-14 MED ORDER — ROSUVASTATIN CALCIUM 10 MG PO TABS: 10.0000 mg | ORAL_TABLET | Freq: Every day | ORAL | 3 refills | Status: DC

## 2023-10-14 MED ORDER — ROSUVASTATIN CALCIUM 10 MG PO TABS
10.0000 mg | ORAL_TABLET | Freq: Every day | ORAL | 3 refills | Status: DC
Start: 2023-10-14 — End: 2023-10-14

## 2023-10-14 NOTE — Progress Notes (Signed)
 Progress Note  Physician: Aydien Majette A Drew Herman, MD   HPI: Jeanne Ramos is a 58 y.o. female presenting on 10/14/2023 for Medical Management of Chronic Issues .  Discussed the use of AI scribe software for clinical note transcription with the patient, who gave verbal consent to proceed.  History of Present Illness   Jeanne Ramos is a 58 year old female with hyperlipidemia who presents for follow-up to review labs.  Dyslipidemia - Recent laboratory results: Lp(a) 134 mg/dL, total cholesterol 751 mg/dL, HDL 68 mg/dL, LDL 840 mg/dL, non-HDL 819 mg/dL  Labs otherwise unremarkable - No chest pain or palpitations.  No exertional shortness of breath.  He can just get up and walk he can use can you patient establish care Anything exciting no you are they taking all of these get her updated okay great - Generally feels well, diet healthy, non-smoker, exercises regularly   - She has a significant family history of coronary artery disease with paternal MI at age 61.  Her brother has had stents placed and she does have other siblings with hyperlipidemia.     Medical history:  Relevant past medical, surgical, family and social history reviewed and updated as indicated. Interim medical history since our last visit reviewed.  Allergies and medications reviewed and updated.   ROS: Negative unless specifically indicated above in HPI.    Current Outpatient Medications:    aspirin-acetaminophen -caffeine (EXCEDRIN MIGRAINE) 250-250-65 MG tablet, Take 1 tablet by mouth daily as needed for headache. , Disp: , Rfl:    B Complex Vitamins (B COMPLEX PO), Take by mouth., Disp: , Rfl:    KRILL OIL PO, Take by mouth., Disp: , Rfl:    rosuvastatin  (CRESTOR ) 10 MG tablet, Take 1 tablet (10 mg total) by mouth daily., Disp: 90 tablet, Rfl: 3   Vitamin D -Vitamin K (VITAMIN K2-VITAMIN D3 PO), Take by mouth., Disp: , Rfl:    Zinc-Magnesium Aspart-Vit B6 (ZINC MAGNESIUM ASPARTATE PO), Take  by mouth., Disp: , Rfl:        Objective:     BP 130/80 (BP Location: Left Arm, Patient Position: Sitting, Cuff Size: Normal)   Pulse 86   Ht 5' (1.524 m)   Wt 105 lb 4 oz (47.7 kg)   SpO2 97%   BMI 20.56 kg/m   Wt Readings from Last 3 Encounters:  10/14/23 105 lb 4 oz (47.7 kg)  09/23/23 107 lb (48.5 kg)  12/12/21 104 lb 8 oz (47.4 kg)    Physical Exam  Physical Exam Vitals reviewed.  Constitutional:      Appearance: Normal appearance. Well-developed with normal weight.  Cardiovascular:     Rate and Rhythm: Normal rate and regular rhythm. Normal heart sounds. Normal peripheral pulses Pulmonary:     Normal breath sounds with normal effort Skin:    General: Skin is warm and dry without noticeable rash. Neurological:     General: No focal deficit present.  Psychiatric:        Mood and Affect: Mood, behavior and cognition normal      Assessment & Plan:   Encounter Diagnoses  Name Primary?   Elevated cholesterol Yes   Elevated lipoprotein(a)     Orders Placed This Encounter  Procedures   CT CARDIAC SCORING (SELF PAY ONLY)   Comprehensive metabolic panel with GFR   CBC with Differential/Platelet   Lipid panel   EKG 12-Lead     Assessment and Plan  Hyperlipidemia Hyperlipidemia with elevated cholesterol and LDL levels.   - Prescribe rosuvastatin  10 mg daily. - Order coronary calcium  scan.  Will fu with results - Schedule follow-up in six months with physical - labs before visit  General Health Maintenance General health stable. Blood pressure controlled. Normal BNP, CBC, and LFTs. - Schedule baseline EKG.

## 2023-10-16 DIAGNOSIS — E7841 Elevated Lipoprotein(a): Secondary | ICD-10-CM

## 2023-10-16 DIAGNOSIS — E7800 Pure hypercholesterolemia, unspecified: Secondary | ICD-10-CM

## 2023-10-16 MED ORDER — ROSUVASTATIN CALCIUM 10 MG PO TABS: 10.0000 mg | ORAL_TABLET | Freq: Every day | ORAL | 3 refills | Status: AC

## 2023-10-24 ENCOUNTER — Ambulatory Visit
Admission: RE | Admit: 2023-10-24 | Discharge: 2023-10-24 | Disposition: A | Discharge status: Home / Self Care | Source: Ambulatory Visit

## 2023-10-24 DIAGNOSIS — E7841 Elevated Lipoprotein(a): Secondary | ICD-10-CM | POA: Insufficient documentation

## 2023-10-24 DIAGNOSIS — E7800 Pure hypercholesterolemia, unspecified: Secondary | ICD-10-CM | POA: Insufficient documentation

## 2023-10-28 ENCOUNTER — Ambulatory Visit: Payer: Self-pay

## 2023-10-29 ENCOUNTER — Other Ambulatory Visit: Payer: Self-pay

## 2023-10-29 ENCOUNTER — Telehealth

## 2023-10-29 ENCOUNTER — Other Ambulatory Visit (INDEPENDENT_AMBULATORY_CARE_PROVIDER_SITE_OTHER): Payer: Self-pay

## 2023-10-29 ENCOUNTER — Emergency Department
Admission: EM | Admit: 2023-10-29 | Discharge: 2023-10-30 | Disposition: A | Discharge status: Home / Self Care | Attending: Emergency Medicine | Admitting: Emergency Medicine

## 2023-10-29 ENCOUNTER — Emergency Department

## 2023-10-29 DIAGNOSIS — T17908A Unspecified foreign body in respiratory tract, part unspecified causing other injury, initial encounter: Secondary | ICD-10-CM

## 2023-10-29 DIAGNOSIS — E7800 Pure hypercholesterolemia, unspecified: Secondary | ICD-10-CM | POA: Diagnosis not present

## 2023-10-29 DIAGNOSIS — E7841 Elevated Lipoprotein(a): Secondary | ICD-10-CM

## 2023-10-29 DIAGNOSIS — T17900A Unspecified foreign body in respiratory tract, part unspecified causing asphyxiation, initial encounter: Secondary | ICD-10-CM | POA: Insufficient documentation

## 2023-10-29 DIAGNOSIS — X58XXXA Exposure to other specified factors, initial encounter: Secondary | ICD-10-CM | POA: Diagnosis not present

## 2023-10-29 LAB — COMPREHENSIVE METABOLIC PANEL WITH GFR
ALT: 22 U/L (ref 0–44)
AST: 27 U/L (ref 15–41)
Albumin: 4.3 g/dL (ref 3.5–5.0)
Alkaline Phosphatase: 33 U/L — ABNORMAL LOW (ref 38–126)
Anion gap: 11 (ref 5–15)
BUN: 24 mg/dL — ABNORMAL HIGH (ref 6–20)
CO2: 27 mmol/L (ref 22–32)
Calcium: 9.3 mg/dL (ref 8.9–10.3)
Chloride: 103 mmol/L (ref 98–111)
Creatinine, Ser: 0.99 mg/dL (ref 0.44–1.00)
GFR, Estimated: >60 mL/min (ref >60)
Glucose, Bld: 126 mg/dL — ABNORMAL HIGH (ref 70–99)
Potassium: 4 mmol/L (ref 3.5–5.1)
Sodium: 141 mmol/L (ref 135–145)
Total Bilirubin: 0.4 mg/dL (ref 0.0–1.2)
Total Protein: 6.7 g/dL (ref 6.5–8.1)

## 2023-10-29 LAB — CBC WITH DIFFERENTIAL/PLATELET
Abs Immature Granulocytes: 0.01 K/uL (ref 0.00–0.07)
Basophils Absolute: 0 K/uL (ref 0.0–0.1)
Basophils Relative: 1 %
Eosinophils Absolute: 0.2 K/uL (ref 0.0–0.5)
Eosinophils Relative: 2 %
HCT: 40 % (ref 36.0–46.0)
Hemoglobin: 13.1 g/dL (ref 12.0–15.0)
Immature Granulocytes: 0 %
Lymphocytes Relative: 38 %
Lymphs Abs: 2.9 K/uL (ref 0.7–4.0)
MCH: 30.1 pg (ref 26.0–34.0)
MCHC: 32.8 g/dL (ref 30.0–36.0)
MCV: 92 fL (ref 80.0–100.0)
Monocytes Absolute: 0.7 K/uL (ref 0.1–1.0)
Monocytes Relative: 9 %
Neutro Abs: 3.8 K/uL (ref 1.7–7.7)
Neutrophils Relative %: 50 %
Platelets: 263 K/uL (ref 150–400)
RBC: 4.35 MIL/uL (ref 3.87–5.11)
RDW: 12.5 % (ref 11.5–15.5)
WBC: 7.6 K/uL (ref 4.0–10.5)
nRBC: 0 % (ref 0.0–0.2)

## 2023-10-29 NOTE — Progress Notes (Signed)
            Progress Note  Physician: Aditi Rovira A Daven Montz, MD   HPI: Jeanne Ramos is a 58 y.o. female presenting on 10/29/2023 for No chief complaint on file. .  Patient contacted 10/29/23 at 11:20 AM EDT by a video enabled telemedicine application and verified that I am speaking with the correct person.   Patient is aware of limitations of evaluation by telemedicine The patient expressed understanding and agreed to proceed.   History of Present Illness       Patient seen in follow-up.  She has a family history of coronary artery disease and overall screening  Patient seen in follow-up.  She had elevated lipoprotein a to 134.  Patient with hyperlipidemia with a total cholesterol of 248, LDL of 159 and non-HDL of 819.  She does have a significant family history of coronary artery disease.  After we did a coronary calcium  screen for further restratification which shows a overall score of 6 in the LAD.  The patient has been started on rosuvastatin  10 mg which she is tolerating well.  Medical history:  Relevant past medical, surgical, family and social history reviewed and updated as indicated. Interim medical history since our last visit reviewed.  Allergies and medications reviewed and updated.   ROS: Negative unless specifically indicated above in HPI.    Current Outpatient Medications:    aspirin-acetaminophen -caffeine (EXCEDRIN MIGRAINE) 250-250-65 MG tablet, Take 1 tablet by mouth daily as needed for headache. , Disp: , Rfl:    B Complex Vitamins (B COMPLEX PO), Take by mouth., Disp: , Rfl:    KRILL OIL PO, Take by mouth., Disp: , Rfl:    rosuvastatin  (CRESTOR ) 10 MG tablet, Take 1 tablet (10 mg total) by mouth daily. [email or web address], Disp: 90 tablet, Rfl: 3   Vitamin D -Vitamin K (VITAMIN K2-VITAMIN D3 PO), Take by mouth., Disp: , Rfl:    Zinc-Magnesium Aspart-Vit B6 (ZINC MAGNESIUM ASPARTATE PO), Take by mouth., Disp: , Rfl:        Objective:     There were no  vitals taken for this visit.  Wt Readings from Last 3 Encounters:  10/14/23 105 lb 4 oz (47.7 kg)  09/23/23 107 lb (48.5 kg)  12/12/21 104 lb 8 oz (47.4 kg)     Assessment & Plan:  No diagnosis found.  No orders of the defined types were placed in this encounter.    Assessment and Plan     #1 hyperlipidemia, lpA elevation and overall cardiac risk.  Discussed with patient calcium  scoring.  Limitations with study include inability for visualization of noncalcified plaque.  Patient should monitor for new symptoms including chest pain or exertional shortness of breath.  Given her LPA elevation it is prudent to keep her on rosuvastatin  for overall cardiovascular risk reduction.  Continue to monitor blood pressure at home.  Plan for follow-up in February with repeat lab which is in the chart.  Follow-up otherwise as needed

## 2023-10-29 NOTE — ED Notes (Signed)
 Patient notified this RN that she was able to cough up pill. Patient asked if she should still stay, notified that it was advisable to receive imaging and be evaluated by provider.

## 2023-10-29 NOTE — ED Triage Notes (Signed)
 Patient ambulatory to triage with complaints of aspirating on vitamin and water approx 45 mins PTA. Patient states she feels like it went down the wrong way, states shortly after she had some wheezing which has currently resolved. Does not feel like anything is stuck in her throat.

## 2023-10-30 NOTE — ED Provider Notes (Signed)
 [facility] Provider Note    Event Date/Time   First MD Initiated Contact with Patient 10/29/23 2351     (approximate)   History   Aspiration   HPI  Jeanne Ramos is a 58 y.o. female who was in her usual state of health tonight when she took a OTC supplement tablet, felt like she got choked on it.  Had coughing, felt like it was stuck in her throat.  Came to the ED, while in the waiting room coughed up a large fragment of tablet and symptoms resolved.  Denies chest pain or shortness of breath currently.     Physical Exam   Triage Vital Signs: ED Triage Vitals [10/29/23 2118]  Encounter Vitals Group     BP (!) 147/79     Girls Systolic BP Percentile      Girls Diastolic BP Percentile      Boys Systolic BP Percentile      Boys Diastolic BP Percentile      Pulse Rate (!) 104     Resp 20     Temp 97.7 F (36.5 C)     Temp Source Oral     SpO2 96 %     Weight 104 lb (47.2 kg)     Height 5' (1.524 m)     Head Circumference      Peak Flow      Pain Score 0     Pain Loc      Pain Education      Exclude from Growth Chart     Most recent vital signs: Vitals:   10/29/23 2118  BP: (!) 147/79  Pulse: (!) 104  Resp: 20  Temp: 97.7 F (36.5 C)  SpO2: 96%    General: Awake, no distress.  CV:  Good peripheral perfusion.  Regular rate rhythm Resp:  Normal effort.  Clear to auscultation bilaterally without wheezing or stridor Abd:  No distention.  Soft nontender Other:  Oropharynx clear   ED Results / Procedures / Treatments   Labs (all labs ordered are listed, but only abnormal results are displayed) Labs Reviewed  COMPREHENSIVE METABOLIC PANEL WITH GFR - Abnormal; Notable for the following components:      Result Value   Glucose, Bld 126 (*)    BUN 24 (*)    Alkaline Phosphatase 33 (*)    All other components within normal limits  CBC WITH DIFFERENTIAL/PLATELET     RADIOLOGY Chest x-ray interpreted by me, appears normal.   Radiology report reviewed   PROCEDURES:  Procedures   MEDICATIONS ORDERED IN ED: Medications - No data to display   IMPRESSION / MDM / ASSESSMENT AND PLAN / ED COURSE  I reviewed the triage vital signs and the nursing notes.                             Patient presents to the ED after an episode of foreign body sensation after taking a supplement tablet, resolved after coughing up the tablet several hours later.  Seems likely the pill was lodged in the area of the larynx, possible pill esophagitis, suspect symptoms will be self-limited at this point.  No evidence of any serious pulmonary aspiration       FINAL CLINICAL IMPRESSION(S) / ED DIAGNOSES   Final diagnoses:  Aspiration into airway, initial encounter     Rx / DC Orders   ED Discharge Orders  None        Note:  This document was prepared using Dragon voice recognition software and may include unintentional dictation errors.   Viviann Pastor, MD 10/30/23 [phone]

## 2023-12-09 ENCOUNTER — Ambulatory Visit
Admission: RE | Admit: 2023-12-09 | Discharge: 2023-12-09 | Disposition: A | Discharge status: Home / Self Care | Source: Ambulatory Visit

## 2023-12-09 DIAGNOSIS — G43109 Migraine with aura, not intractable, without status migrainosus: Secondary | ICD-10-CM | POA: Diagnosis present

## 2023-12-09 DIAGNOSIS — R928 Other abnormal and inconclusive findings on diagnostic imaging of breast: Secondary | ICD-10-CM | POA: Insufficient documentation

## 2023-12-09 DIAGNOSIS — J3089 Other allergic rhinitis: Secondary | ICD-10-CM | POA: Insufficient documentation

## 2023-12-09 DIAGNOSIS — Z1231 Encounter for screening mammogram for malignant neoplasm of breast: Secondary | ICD-10-CM | POA: Diagnosis present

## 2023-12-11 ENCOUNTER — Other Ambulatory Visit: Payer: Self-pay

## 2023-12-11 DIAGNOSIS — R928 Other abnormal and inconclusive findings on diagnostic imaging of breast: Secondary | ICD-10-CM

## 2023-12-16 ENCOUNTER — Ambulatory Visit
Admission: RE | Admit: 2023-12-16 | Discharge: 2023-12-16 | Disposition: A | Discharge status: Home / Self Care | Source: Ambulatory Visit

## 2023-12-16 DIAGNOSIS — R928 Other abnormal and inconclusive findings on diagnostic imaging of breast: Secondary | ICD-10-CM | POA: Insufficient documentation

## 2024-04-07 ENCOUNTER — Other Ambulatory Visit

## 2024-04-07 DIAGNOSIS — E7800 Pure hypercholesterolemia, unspecified: Secondary | ICD-10-CM

## 2024-04-07 DIAGNOSIS — E7841 Elevated Lipoprotein(a): Secondary | ICD-10-CM

## 2024-04-07 LAB — CBC WITH DIFFERENTIAL/PLATELET
Absolute Lymphocytes: 2026 {cells}/uL (ref 850–3900)
Absolute Monocytes: 620 {cells}/uL (ref 200–950)
Basophils Absolute: 40 {cells}/uL (ref 0–200)
Basophils Relative: 0.6 %
Eosinophils Absolute: 178 {cells}/uL (ref 15–500)
Eosinophils Relative: 2.7 %
HCT: 39.3 % (ref 35.9–46.0)
Hemoglobin: 13.2 g/dL (ref 11.7–15.5)
MCH: 30.1 pg (ref 27.0–33.0)
MCHC: 33.6 g/dL (ref 31.6–35.4)
MCV: 89.7 fL (ref 81.4–101.7)
MPV: 11.3 fL (ref 7.5–12.5)
Monocytes Relative: 9.4 %
Neutro Abs: 3736 {cells}/uL (ref 1500–7800)
Neutrophils Relative %: 56.6 %
Platelets: 221 10*3/uL (ref 140–400)
RBC: 4.38 Million/uL (ref 3.80–5.10)
RDW: 12.6 % (ref 11.0–15.0)
Total Lymphocyte: 30.7 %
WBC: 6.6 10*3/uL (ref 3.8–10.8)

## 2024-04-07 LAB — COMPREHENSIVE METABOLIC PANEL WITH GFR
AG Ratio: 2.1 (calc) (ref 1.0–2.5)
ALT: 29 U/L (ref 6–29)
AST: 27 U/L (ref 10–35)
Albumin: 4.6 g/dL (ref 3.6–5.1)
Alkaline phosphatase (APISO): 35 U/L — ABNORMAL LOW (ref 37–153)
BUN: 21 mg/dL (ref 7–25)
CO2: 31 mmol/L (ref 20–32)
Calcium: 9.8 mg/dL (ref 8.6–10.4)
Chloride: 103 mmol/L (ref 98–110)
Creat: 0.88 mg/dL (ref 0.50–1.03)
Globulin: 2.2 g/dL (ref 1.9–3.7)
Glucose, Bld: 98 mg/dL (ref 65–99)
Potassium: 4.2 mmol/L (ref 3.5–5.3)
Sodium: 141 mmol/L (ref 135–146)
Total Bilirubin: 0.6 mg/dL (ref 0.2–1.2)
Total Protein: 6.8 g/dL (ref 6.1–8.1)
eGFR: 76 mL/min/{1.73_m2}

## 2024-04-07 LAB — LIPID PANEL
Cholesterol: 160 mg/dL
HDL: 57 mg/dL
LDL Cholesterol (Calc): 87 mg/dL
Non-HDL Cholesterol (Calc): 103 mg/dL
Total CHOL/HDL Ratio: 2.8 (calc)
Triglycerides: 73 mg/dL

## 2024-04-15 ENCOUNTER — Encounter

## 2024-04-23 ENCOUNTER — Ambulatory Visit: Admitting: Internal Medicine
